# Patient Record
Sex: Female | Born: 1940 | Race: White | Hispanic: No | Marital: Married | Smoking: Never smoker
Health system: Southern US, Community
[De-identification: ages and names within clinical notes are randomized; demographics above are authoritative.]

## PROBLEM LIST (undated history)

## (undated) DIAGNOSIS — I10 Essential (primary) hypertension: Secondary | ICD-10-CM

## (undated) DIAGNOSIS — M069 Rheumatoid arthritis, unspecified: Secondary | ICD-10-CM

## (undated) DIAGNOSIS — E079 Disorder of thyroid, unspecified: Secondary | ICD-10-CM

## (undated) DIAGNOSIS — I350 Nonrheumatic aortic (valve) stenosis: Secondary | ICD-10-CM

## (undated) HISTORY — PX: CARDIAC VALVE REPLACEMENT: SHX585

---

## 2013-12-23 ENCOUNTER — Inpatient Hospital Stay (HOSPITAL_COMMUNITY): Payer: Medicare Other

## 2013-12-23 ENCOUNTER — Inpatient Hospital Stay (HOSPITAL_COMMUNITY)
Admission: EM | Admit: 2013-12-23 | Discharge: 2013-12-27 | DRG: 392 | Disposition: A | Discharge status: Home / Self Care | Payer: Medicare Other | Attending: Internal Medicine | Admitting: Internal Medicine

## 2013-12-23 ENCOUNTER — Encounter (HOSPITAL_COMMUNITY): Payer: Self-pay | Admitting: Emergency Medicine

## 2013-12-23 DIAGNOSIS — R7881 Bacteremia: Secondary | ICD-10-CM

## 2013-12-23 DIAGNOSIS — K5732 Diverticulitis of large intestine without perforation or abscess without bleeding: Secondary | ICD-10-CM

## 2013-12-23 DIAGNOSIS — Z7982 Long term (current) use of aspirin: Secondary | ICD-10-CM | POA: Diagnosis not present

## 2013-12-23 DIAGNOSIS — L02419 Cutaneous abscess of limb, unspecified: Secondary | ICD-10-CM | POA: Diagnosis present

## 2013-12-23 DIAGNOSIS — Z79899 Other long term (current) drug therapy: Secondary | ICD-10-CM | POA: Diagnosis not present

## 2013-12-23 DIAGNOSIS — I10 Essential (primary) hypertension: Secondary | ICD-10-CM

## 2013-12-23 DIAGNOSIS — E039 Hypothyroidism, unspecified: Secondary | ICD-10-CM | POA: Diagnosis present

## 2013-12-23 DIAGNOSIS — S90569A Insect bite (nonvenomous), unspecified ankle, initial encounter: Secondary | ICD-10-CM | POA: Diagnosis present

## 2013-12-23 DIAGNOSIS — E876 Hypokalemia: Secondary | ICD-10-CM | POA: Diagnosis present

## 2013-12-23 DIAGNOSIS — R6510 Systemic inflammatory response syndrome (SIRS) of non-infectious origin without acute organ dysfunction: Secondary | ICD-10-CM | POA: Diagnosis present

## 2013-12-23 DIAGNOSIS — L03116 Cellulitis of left lower limb: Secondary | ICD-10-CM

## 2013-12-23 DIAGNOSIS — M069 Rheumatoid arthritis, unspecified: Secondary | ICD-10-CM | POA: Diagnosis present

## 2013-12-23 DIAGNOSIS — L03119 Cellulitis of unspecified part of limb: Secondary | ICD-10-CM | POA: Diagnosis present

## 2013-12-23 DIAGNOSIS — A498 Other bacterial infections of unspecified site: Secondary | ICD-10-CM | POA: Diagnosis present

## 2013-12-23 DIAGNOSIS — D696 Thrombocytopenia, unspecified: Secondary | ICD-10-CM

## 2013-12-23 DIAGNOSIS — Z952 Presence of prosthetic heart valve: Secondary | ICD-10-CM | POA: Diagnosis not present

## 2013-12-23 DIAGNOSIS — D6959 Other secondary thrombocytopenia: Secondary | ICD-10-CM | POA: Diagnosis present

## 2013-12-23 DIAGNOSIS — I359 Nonrheumatic aortic valve disorder, unspecified: Secondary | ICD-10-CM | POA: Diagnosis present

## 2013-12-23 DIAGNOSIS — D638 Anemia in other chronic diseases classified elsewhere: Secondary | ICD-10-CM | POA: Diagnosis present

## 2013-12-23 DIAGNOSIS — W57XXXA Bitten or stung by nonvenomous insect and other nonvenomous arthropods, initial encounter: Secondary | ICD-10-CM

## 2013-12-23 DIAGNOSIS — I350 Nonrheumatic aortic (valve) stenosis: Secondary | ICD-10-CM

## 2013-12-23 DIAGNOSIS — K5712 Diverticulitis of small intestine without perforation or abscess without bleeding: Secondary | ICD-10-CM

## 2013-12-23 DIAGNOSIS — L03115 Cellulitis of right lower limb: Secondary | ICD-10-CM

## 2013-12-23 DIAGNOSIS — R509 Fever, unspecified: Secondary | ICD-10-CM

## 2013-12-23 DIAGNOSIS — Z953 Presence of xenogenic heart valve: Secondary | ICD-10-CM

## 2013-12-23 DIAGNOSIS — B9620 Unspecified Escherichia coli [E. coli] as the cause of diseases classified elsewhere: Secondary | ICD-10-CM

## 2013-12-23 DIAGNOSIS — Z6841 Body Mass Index (BMI) 40.0 and over, adult: Secondary | ICD-10-CM | POA: Diagnosis not present

## 2013-12-23 HISTORY — DX: Nonrheumatic aortic (valve) stenosis: I35.0

## 2013-12-23 HISTORY — DX: Disorder of thyroid, unspecified: E07.9

## 2013-12-23 HISTORY — DX: Essential (primary) hypertension: I10

## 2013-12-23 HISTORY — DX: Rheumatoid arthritis, unspecified: M06.9

## 2013-12-23 LAB — CBC
HEMATOCRIT: 33.6 % — AB (ref 36.0–46.0)
HEMOGLOBIN: 11 g/dL — AB (ref 12.0–15.0)
MCH: 30.6 pg (ref 26.0–34.0)
MCHC: 32.7 g/dL (ref 30.0–36.0)
MCV: 93.3 fL (ref 78.0–100.0)
Platelets: 120 10*3/uL — ABNORMAL LOW (ref 150–400)
RBC: 3.6 MIL/uL — AB (ref 3.87–5.11)
RDW: 13.5 % (ref 11.5–15.5)
WBC: 5.4 10*3/uL (ref 4.0–10.5)

## 2013-12-23 LAB — URINALYSIS, ROUTINE W REFLEX MICROSCOPIC
BILIRUBIN URINE: NEGATIVE
Glucose, UA: NEGATIVE mg/dL
Hgb urine dipstick: NEGATIVE
KETONES UR: NEGATIVE mg/dL
Nitrite: NEGATIVE
Protein, ur: 30 mg/dL — AB
Specific Gravity, Urine: 1.02 (ref 1.005–1.030)
UROBILINOGEN UA: 4 mg/dL — AB (ref 0.0–1.0)
pH: 6 (ref 5.0–8.0)

## 2013-12-23 LAB — COMPREHENSIVE METABOLIC PANEL
ALK PHOS: 66 U/L (ref 39–117)
ALT: 18 U/L (ref 0–35)
AST: 31 U/L (ref 0–37)
Albumin: 3.5 g/dL (ref 3.5–5.2)
Anion gap: 15 (ref 5–15)
BILIRUBIN TOTAL: 1.1 mg/dL (ref 0.3–1.2)
BUN: 16 mg/dL (ref 6–23)
CHLORIDE: 101 meq/L (ref 96–112)
CO2: 22 meq/L (ref 19–32)
Calcium: 9.1 mg/dL (ref 8.4–10.5)
Creatinine, Ser: 0.76 mg/dL (ref 0.50–1.10)
GFR calc non Af Amer: 83 mL/min — ABNORMAL LOW (ref >90)
GLUCOSE: 131 mg/dL — AB (ref 70–99)
POTASSIUM: 3.7 meq/L (ref 3.7–5.3)
SODIUM: 138 meq/L (ref 137–147)
Total Protein: 7.4 g/dL (ref 6.0–8.3)

## 2013-12-23 LAB — URINE MICROSCOPIC-ADD ON

## 2013-12-23 MED ORDER — IBUPROFEN 800 MG PO TABS
800.0000 mg | ORAL_TABLET | Freq: Once | ORAL | Status: DC
  Filled 2013-12-23: qty 1

## 2013-12-23 MED ORDER — OXYCODONE HCL 5 MG PO TABS: 5.0000 mg | ORAL_TABLET | ORAL | Status: DC | PRN

## 2013-12-23 MED ORDER — ENOXAPARIN SODIUM 40 MG/0.4ML ~~LOC~~ SOLN
40.0000 mg | SUBCUTANEOUS | Status: DC
  Administered 2013-12-23 – 2013-12-26 (×4): 40 mg via SUBCUTANEOUS
  Filled 2013-12-23 (×5): qty 0.4

## 2013-12-23 MED ORDER — SERTRALINE HCL 50 MG PO TABS
50.0000 mg | ORAL_TABLET | Freq: Every day | ORAL | Status: DC
  Administered 2013-12-24 – 2013-12-27 (×4): 50 mg via ORAL
  Filled 2013-12-23 (×4): qty 1

## 2013-12-23 MED ORDER — ONDANSETRON HCL 4 MG/2ML IJ SOLN: 4.0000 mg | Freq: Four times a day (QID) | INTRAMUSCULAR | Status: DC | PRN

## 2013-12-23 MED ORDER — ATENOLOL 50 MG PO TABS
50.0000 mg | ORAL_TABLET | Freq: Every day | ORAL | Status: DC
  Administered 2013-12-24 – 2013-12-27 (×4): 50 mg via ORAL
  Filled 2013-12-23 (×4): qty 1

## 2013-12-23 MED ORDER — LEVOTHYROXINE SODIUM 100 MCG PO TABS
100.0000 ug | ORAL_TABLET | Freq: Every day | ORAL | Status: DC
  Administered 2013-12-24 – 2013-12-27 (×5): 100 ug via ORAL
  Filled 2013-12-23 (×5): qty 1

## 2013-12-23 MED ORDER — ATORVASTATIN CALCIUM 20 MG PO TABS
20.0000 mg | ORAL_TABLET | Freq: Every day | ORAL | Status: DC
  Administered 2013-12-24 – 2013-12-26 (×3): 20 mg via ORAL
  Filled 2013-12-23 (×4): qty 1

## 2013-12-23 MED ORDER — PANTOPRAZOLE SODIUM 40 MG PO TBEC
40.0000 mg | DELAYED_RELEASE_TABLET | Freq: Every day | ORAL | Status: DC
Start: 2013-12-23 — End: 2013-12-26
  Administered 2013-12-23 – 2013-12-26 (×4): 40 mg via ORAL
  Filled 2013-12-23 (×4): qty 1

## 2013-12-23 MED ORDER — ONDANSETRON HCL 4 MG PO TABS: 4.0000 mg | ORAL_TABLET | Freq: Four times a day (QID) | ORAL | Status: DC | PRN

## 2013-12-23 MED ORDER — ACETAMINOPHEN 325 MG PO TABS
650.0000 mg | ORAL_TABLET | Freq: Four times a day (QID) | ORAL | Status: DC | PRN
  Administered 2013-12-23: 650 mg via ORAL
  Filled 2013-12-23: qty 2

## 2013-12-23 MED ORDER — ACETAMINOPHEN 650 MG RE SUPP: 650.0000 mg | Freq: Four times a day (QID) | RECTAL | Status: DC | PRN

## 2013-12-23 MED ORDER — ASPIRIN EC 81 MG PO TBEC
81.0000 mg | DELAYED_RELEASE_TABLET | Freq: Every day | ORAL | Status: DC
  Administered 2013-12-24 – 2013-12-27 (×4): 81 mg via ORAL
  Filled 2013-12-23 (×4): qty 1

## 2013-12-23 MED ORDER — CLINDAMYCIN PHOSPHATE 600 MG/50ML IV SOLN
600.0000 mg | Freq: Once | INTRAVENOUS | Status: AC
  Administered 2013-12-23: 600 mg via INTRAVENOUS
  Filled 2013-12-23: qty 50

## 2013-12-23 MED ORDER — METHOTREXATE 2.5 MG PO TABS: 15.0000 mg | ORAL_TABLET | ORAL | Status: DC

## 2013-12-23 MED ORDER — MORPHINE SULFATE 2 MG/ML IJ SOLN: 1.0000 mg | INTRAMUSCULAR | Status: DC | PRN

## 2013-12-23 MED ORDER — LOSARTAN POTASSIUM 50 MG PO TABS
50.0000 mg | ORAL_TABLET | Freq: Every day | ORAL | Status: DC
  Administered 2013-12-24: 50 mg via ORAL
  Filled 2013-12-23: qty 1

## 2013-12-23 MED ORDER — VANCOMYCIN HCL IN DEXTROSE 1-5 GM/200ML-% IV SOLN
1000.0000 mg | Freq: Once | INTRAVENOUS | Status: AC
  Administered 2013-12-23: 1000 mg via INTRAVENOUS
  Filled 2013-12-23: qty 200

## 2013-12-23 MED ORDER — ACETAMINOPHEN 325 MG PO TABS
650.0000 mg | ORAL_TABLET | Freq: Once | ORAL | Status: AC
  Administered 2013-12-23: 650 mg via ORAL
  Filled 2013-12-23: qty 2

## 2013-12-23 MED ORDER — PIPERACILLIN-TAZOBACTAM 3.375 G IVPB 30 MIN
3.3750 g | Freq: Once | INTRAVENOUS | Status: AC
  Administered 2013-12-23: 3.375 g via INTRAVENOUS
  Filled 2013-12-23: qty 50

## 2013-12-23 MED ORDER — SODIUM CHLORIDE 0.9 % IV BOLUS (SEPSIS)
1000.0000 mL | Freq: Once | INTRAVENOUS | Status: AC
  Administered 2013-12-23: 1000 mL via INTRAVENOUS

## 2013-12-23 MED ORDER — HYDROXYCHLOROQUINE SULFATE 200 MG PO TABS
200.0000 mg | ORAL_TABLET | Freq: Two times a day (BID) | ORAL | Status: DC
  Administered 2013-12-23: 200 mg via ORAL
  Filled 2013-12-23 (×3): qty 1

## 2013-12-23 NOTE — ED Notes (Signed)
Pt has episode of chills and is shaking. Family request to speak to EDP. Dr Karma Ganja at bedside.

## 2013-12-23 NOTE — ED Notes (Signed)
She c/o "bad bug bite on my leg".  C/o chills and feeling "bad" today.  Se[facility] a [facility] yesterday and they prescribed Amoxil. For prophylaxis d/t mitral valve issues.

## 2013-12-23 NOTE — ED Notes (Signed)
Pt states that she noticed bug bite to back of L calf two days ago. Pt then started to run fever yesterday and today. Around what appears to be a bite, pt has scratched the skin off to a dry scab and has reddened area that is hot to touch. Pt is A&O and in NAD. Pt lung sounds clear bilaterally.

## 2013-12-23 NOTE — ED Provider Notes (Signed)
CSN: [id number]     Arrival date & time 12/23/13  1422 History   First MD Initiated Contact with Patient 12/23/13 1504     Chief Complaint  Patient presents with  . Chills     (Consider location/radiation/quality/duration/timing/severity/associated sxs/prior Treatment) HPI Pt presents with c/o fever and chills with generalized malaise.  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  Symptoms began yesterday, this morning she felt improved until she began to have hard shaking chills.  She notes an insect bite to the back of her left leg 2days ago which has become more red since last night.  No pain in leg.  Denies cough or difficulty breathing.  There are no other associated systemic symptoms, there are no other alleviating or modifying factors.   Past Medical History  Diagnosis Date  . Hypertension   . Rheumatoid arthritis(714.0)   . Aortic stenosis   . Thyroid disease    Past Surgical History  Procedure Laterality Date  . Cardiac valve replacement      Bovine valve   History reviewed. No pertinent family history. History  Substance Use Topics  . Smoking status: Never Smoker   . Smokeless tobacco: Not on file  . Alcohol Use: Yes     Comment: Social   OB History   Grav Para Term Preterm Abortions TAB SAB Ect Mult Living                 Review of Systems ROS reviewed and all otherwise negative except for mentioned in HPI    Allergies  Review of patient's allergies indicates no known allergies.  Home Medications   Prior to Admission medications   Medication Sig Start Date End Date Taking? Authorizing Provider  alendronate (FOSAMAX) 70 MG tablet Take 70 mg by mouth every Sunday. Take with a full glass of water on an empty stomach.   Yes Historical Provider, MD  aspirin EC 81 MG tablet Take 81 mg by mouth daily.   Yes Historical Provider, MD  atenolol  (TENORMIN) 50 MG tablet Take 50 mg by mouth daily.   Yes Historical Provider, MD  calcium carbonate (OS-CAL) 600 MG TABS tablet Take 600 mg by mouth 2 (two) times daily with a meal.   Yes Historical Provider, MD  celecoxib (CELEBREX) 200 MG capsule Take 200 mg by mouth daily.   Yes Historical Provider, MD  cholecalciferol (VITAMIN D) 1000 UNITS tablet Take 1,000 Units by mouth daily.   Yes Historical Provider, MD  ferrous sulfate 325 (65 FE) MG tablet Take 325 mg by mouth daily with breakfast.   Yes Historical Provider, MD  folic acid (FOLVITE) 1 MG tablet Take 1 mg by mouth daily.   Yes Historical Provider, MD  hydroxychloroquine (PLAQUENIL) 200 MG tablet Take 200 mg by mouth 2 (two) times daily.   Yes Historical Provider, MD  levothyroxine (SYNTHROID, LEVOTHROID) 100 MCG tablet Take 100 mcg by mouth daily before breakfast.   Yes Historical Provider, MD  losartan (COZAAR) 50 MG tablet Take 50 mg by mouth daily.   Yes Historical Provider, MD  methotrexate (RHEUMATREX) 2.5 MG tablet Take 15 mg by mouth every Thursday. Caution:Chemotherapy. Protect from light.   Yes Historical Provider, MD  Omega-3 Fatty Acids (FISH OIL) 500 MG CAPS Take 500 mg by mouth 2 (two) times daily.   Yes Historical Provider, MD  omeprazole (PRILOSEC) 20 MG  capsule Take 20 mg by mouth daily.   Yes Historical Provider, MD  rosuvastatin (CRESTOR) 10 MG tablet Take 10 mg by mouth daily.   Yes Historical Provider, MD  sertraline (ZOLOFT) 50 MG tablet Take 50 mg by mouth daily.   Yes Historical Provider, MD   BP 142/80  Pulse 100  Temp(Src) 100.7 F (38.2 C) (Oral)  Resp 18  Ht 5\' 3"  (1.6 m)  Wt 229 lb 11.5 oz (104.2 kg)  BMI 40.70 kg/m2  SpO2 97% Vitals reviewed Physical Exam Physical Examination: General appearance - alert, well appearing, and in no distress Mental status - alert, oriented to person, place, and time Eyes - pupils equal and reactive, extraocular eye movements intact Mouth - mucous membranes moist,  pharynx normal without lesions Chest - clear to auscultation, no wheezes, rales or rhonchi, symmetric air entry Heart - normal rate, regular rhythm, normal S1, S2, no murmurs, rubs, clicks or gallops Abdomen - soft, nontender, nondistended, no masses or organomegaly Extremities - peripheral pulses normal, no pedal edema, no clubbing or cyanosis Skin - on posterior left calf there is abraded area approx 1cm, surrounding erythema approx 8cm, no induration or fluctuance  ED Course  Procedures (including critical care time)  5:43 PM on recheck pt is feeling much improved, she is talkative and smiling, feels much better.  Pt started on IV clindamycin- appears the cellulitis on the back of her leg is the source of her fever.    Labs Review Labs Reviewed  CBC - Abnormal; Notable for the following:    RBC 3.60 (*)    Hemoglobin 11.0 (*)    HCT 33.6 (*)    Platelets 120 (*)    All other components within normal limits  COMPREHENSIVE METABOLIC PANEL - Abnormal; Notable for the following:    Glucose, Bld 131 (*)    GFR calc non Af Amer 83 (*)    All other components within normal limits  URINALYSIS, ROUTINE W REFLEX MICROSCOPIC - Abnormal; Notable for the following:    Color, Urine AMBER (*)    Protein, ur 30 (*)    Urobilinogen, UA 4.0 (*)    Leukocytes, UA SMALL (*)    All other components within normal limits  CULTURE, BLOOD (ROUTINE X 2)  CULTURE, BLOOD (ROUTINE X 2)  URINE MICROSCOPIC-ADD ON    Imaging Review Dg Chest 2 View  12/23/2013   CLINICAL DATA:  Fever  EXAM: CHEST  2 VIEW  COMPARISON:  None.  FINDINGS: Aortic valve replacement. Heart size and vascularity are normal. Negative for heart failure. Negative for pneumonia or effusion. Lungs are clear.  IMPRESSION: No active cardiopulmonary disease.   Electronically Signed   By: 02/22/2014 M.D.   On: 12/23/2013 20:30     EKG Interpretation None      MDM   Final diagnoses:  Rheumatoid arthritis(714.0)  Cellulitis of  left leg  Fever chills    Pt presenting with c/o fever/chills, malaise, has cellulitis on her lower posterior leg after insect bite 2 days ago.  Of note, pt has no leukocytosis, UA is reassuring, no signs or symptoms of pneumonia.  Was started on clindamycin for cellultiis, then began to have recurrence of rigors, on recheck area of cellulitis was not present as it had been approx 30 minutes prior ? Vasoconstriction with fever/chills.  Blood cultures obtained and will admit to [facility].  Bovine aortic valve- endocarditis is on the differential.  D/w [facility] and they are agreeable with plan for  admission.      Ethelda Chick, MD 12/23/13 2134

## 2013-12-23 NOTE — H&P (Signed)
[facility] History and Physical  Levy Wellman FOY:[id number] DOB: 08-Jan-1941 DOA: 12/23/2013  Referring physician: EDP PCP: No primary provider on file.   Chief Complaint: FEVER YESTERDAY   HPI: Holly Hooper is a 73 y.o. female with prior h/o hypertension, RA, anemia, h/o aortic valve replacement two years ago, comes in for a bug bite two days ago , followed by fever and chills yesterday. She presents to ED for the above complaints. Also reports occasional diarrhea. On arrival to ED, she was found to be febrile , no leukocytosis. She is referred to [facility] for admission .    Review of Systems:  Constitutional:  No weight loss, night sweats, fatigue. Positive for fever and chills. HEENT:  No headaches, Difficulty swallowing,Tooth/dental problems,Sore throat,  No sneezing, itching, ear ache, nasal congestion, post nasal drip,  Cardio-vascular:  No chest pain, Orthopnea, PND, swelling in lower extremities, anasarca, dizziness, palpitations  GI:  No heartburn, indigestion, abdominal pain,vomiting, change in bowel habits, loss of appetite . Positive for nausea and diarrhea Resp:  Sob with chills.  No excess mucus, no productive cough, No non-productive cough, No coughing up of blood.No change in color of mucus.No wheezing.No chest wall deformity  Skin:  Left leg redness.  GU:  no dysuria, change in color of urine, no urgency or frequency. No flank pain.  Musculoskeletal:  No joint pain or swelling. No decreased range of motion. No back pain.    Past Medical History  Diagnosis Date  . Hypertension   . Rheumatoid arthritis(714.0)   . Aortic stenosis   . Thyroid disease    Past Surgical History  Procedure Laterality Date  . Cardiac valve replacement      Bovine valve   Social History:  reports that she has never smoked. She does not have any smokeless tobacco history on file. She reports that she drinks alcohol. Her drug history is not on file.  No Known Allergies  No family  history on file.   Prior to Admission medications   Medication Sig Start Date End Date Taking? Authorizing Provider  alendronate (FOSAMAX) 70 MG tablet Take 70 mg by mouth every Sunday. Take with a full glass of water on an empty stomach.   Yes Historical Provider, MD  aspirin EC 81 MG tablet Take 81 mg by mouth daily.   Yes Historical Provider, MD  atenolol (TENORMIN) 50 MG tablet Take 50 mg by mouth daily.   Yes Historical Provider, MD  calcium carbonate (OS-CAL) 600 MG TABS tablet Take 600 mg by mouth 2 (two) times daily with a meal.   Yes Historical Provider, MD  celecoxib (CELEBREX) 200 MG capsule Take 200 mg by mouth daily.   Yes Historical Provider, MD  cholecalciferol (VITAMIN D) 1000 UNITS tablet Take 1,000 Units by mouth daily.   Yes Historical Provider, MD  ferrous sulfate 325 (65 FE) MG tablet Take 325 mg by mouth daily with breakfast.   Yes Historical Provider, MD  folic acid (FOLVITE) 1 MG tablet Take 1 mg by mouth daily.   Yes Historical Provider, MD  hydroxychloroquine (PLAQUENIL) 200 MG tablet Take 200 mg by mouth 2 (two) times daily.   Yes Historical Provider, MD  levothyroxine (SYNTHROID, LEVOTHROID) 100 MCG tablet Take 100 mcg by mouth daily before breakfast.   Yes Historical Provider, MD  losartan (COZAAR) 50 MG tablet Take 50 mg by mouth daily.   Yes Historical Provider, MD  methotrexate (RHEUMATREX) 2.5 MG tablet Take 15 mg by mouth every Thursday. Caution:Chemotherapy. Protect  from light.   Yes Historical Provider, MD  Omega-3 Fatty Acids (FISH OIL) 500 MG CAPS Take 500 mg by mouth 2 (two) times daily.   Yes Historical Provider, MD  omeprazole (PRILOSEC) 20 MG capsule Take 20 mg by mouth daily.   Yes Historical Provider, MD  rosuvastatin (CRESTOR) 10 MG tablet Take 10 mg by mouth daily.   Yes Historical Provider, MD  sertraline (ZOLOFT) 50 MG tablet Take 50 mg by mouth daily.   Yes Historical Provider, MD   Physical Exam: Filed Vitals:   12/23/13 1730 12/23/13 1740  12/23/13 1750 12/23/13 1822  BP:  132/59 132/59 144/67  Pulse: 80 79 80   Temp:   99.5 F (37.5 C)   TempSrc:   Oral   Resp:      SpO2: 94% 96% 98%     Wt Readings from Last 3 Encounters:  No data found for Wt    General:  Appears calm and comfortable Eyes: PERRL, normal lids, irises & conjunctiva Neck: no LAD, masses or thyromegaly Cardiovascular: tachycardic, ES murmer, trace pedal edema.  Respiratory: CTA bilaterally, no w/r/r. Normal respiratory effort. Abdomen: soft, ntnd Skin: no rash or induration seen on limited exam Musculoskeletal: RA changes in the hands bilateral. Left calf redness seen.  Neurologic: alert and oriented to place, person and time. No focal deficits. Speech normal.           Labs on Admission:  Basic Metabolic Panel:  Recent Labs Lab 12/23/13 1540  NA 138  K 3.7  CL 101  CO2 22  GLUCOSE 131*  BUN 16  CREATININE 0.76  CALCIUM 9.1   Liver Function Tests:  Recent Labs Lab 12/23/13 1540  AST 31  ALT 18  ALKPHOS 66  BILITOT 1.1  PROT 7.4  ALBUMIN 3.5   No results found for this basename: LIPASE, AMYLASE,  in the last 168 hours No results found for this basename: AMMONIA,  in the last 168 hours CBC:  Recent Labs Lab 12/23/13 1540  WBC 5.4  HGB 11.0*  HCT 33.6*  MCV 93.3  PLT 120*   Cardiac Enzymes: No results found for this basename: CKTOTAL, CKMB, CKMBINDEX, TROPONINI,  in the last 168 hours  BNP (last 3 results) No results found for this basename: PROBNP,  in the last 8760 hours CBG: No results found for this basename: GLUCAP,  in the last 168 hours  Radiological Exams on Admission: No results found.  EKG: Assessment/Plan Active Problems:   Cellulitis of lower extremity   Cellulitis of left leg  1. Cellulitis of the left leg; - admit to med surg. Start patient on vancomycin and zosyn because of her ongoing chills and fever.  - blood cultures drawn.   2. Fever and chills; - blood cultures drawn.  - on IV  antibiotics.  - get CXR.    3. Bovine AV replacement.: - stable.   4. Hypertension; Controlled.   5. RA: - resume home medications.    Code Status: full code presumed DVT Prophylaxis: lovenox Family Communication: none at bedside Disposition Plan: admit to med surg  Time spent: 55 min  [facility] Pager [phone]  **Disclaimer: This note may have been dictated with voice recognition software. Similar sounding words can inadvertently be transcribed and this note may contain transcription errors which may not have been corrected upon publication of note.**

## 2013-12-24 ENCOUNTER — Inpatient Hospital Stay (HOSPITAL_COMMUNITY): Payer: Medicare Other

## 2013-12-24 DIAGNOSIS — R7881 Bacteremia: Secondary | ICD-10-CM

## 2013-12-24 DIAGNOSIS — Z954 Presence of other heart-valve replacement: Secondary | ICD-10-CM

## 2013-12-24 DIAGNOSIS — Z952 Presence of prosthetic heart valve: Secondary | ICD-10-CM

## 2013-12-24 DIAGNOSIS — M129 Arthropathy, unspecified: Secondary | ICD-10-CM

## 2013-12-24 DIAGNOSIS — R509 Fever, unspecified: Secondary | ICD-10-CM

## 2013-12-24 DIAGNOSIS — I10 Essential (primary) hypertension: Secondary | ICD-10-CM

## 2013-12-24 DIAGNOSIS — B9689 Other specified bacterial agents as the cause of diseases classified elsewhere: Secondary | ICD-10-CM

## 2013-12-24 DIAGNOSIS — D696 Thrombocytopenia, unspecified: Secondary | ICD-10-CM

## 2013-12-24 LAB — CBC WITH DIFFERENTIAL/PLATELET
Basophils Absolute: 0 10*3/uL (ref 0.0–0.1)
Basophils Relative: 0 % (ref 0–1)
EOS ABS: 0.1 10*3/uL (ref 0.0–0.7)
EOS PCT: 2 % (ref 0–5)
HCT: 29.7 % — ABNORMAL LOW (ref 36.0–46.0)
HEMOGLOBIN: 9.9 g/dL — AB (ref 12.0–15.0)
LYMPHS ABS: 0.4 10*3/uL — AB (ref 0.7–4.0)
Lymphocytes Relative: 8 % — ABNORMAL LOW (ref 12–46)
MCH: 30.7 pg (ref 26.0–34.0)
MCHC: 33.3 g/dL (ref 30.0–36.0)
MCV: 92 fL (ref 78.0–100.0)
MONOS PCT: 5 % (ref 3–12)
Monocytes Absolute: 0.2 10*3/uL (ref 0.1–1.0)
Neutro Abs: 4.4 10*3/uL (ref 1.7–7.7)
Neutrophils Relative %: 85 % — ABNORMAL HIGH (ref 43–77)
Platelets: 109 10*3/uL — ABNORMAL LOW (ref 150–400)
RBC: 3.23 MIL/uL — ABNORMAL LOW (ref 3.87–5.11)
RDW: 13.5 % (ref 11.5–15.5)
WBC: 5.2 10*3/uL (ref 4.0–10.5)

## 2013-12-24 LAB — BASIC METABOLIC PANEL
Anion gap: 13 (ref 5–15)
BUN: 12 mg/dL (ref 6–23)
CALCIUM: 8.7 mg/dL (ref 8.4–10.5)
CO2: 24 mEq/L (ref 19–32)
CREATININE: 0.76 mg/dL (ref 0.50–1.10)
Chloride: 103 mEq/L (ref 96–112)
GFR calc Af Amer: >90 mL/min (ref >90)
GFR calc non Af Amer: 83 mL/min — ABNORMAL LOW (ref >90)
GLUCOSE: 134 mg/dL — AB (ref 70–99)
Potassium: 3.3 mEq/L — ABNORMAL LOW (ref 3.7–5.3)
Sodium: 140 mEq/L (ref 137–147)

## 2013-12-24 MED ORDER — IOHEXOL 300 MG/ML  SOLN
100.0000 mL | Freq: Once | INTRAMUSCULAR | Status: AC | PRN
  Administered 2013-12-24: 100 mL via INTRAVENOUS

## 2013-12-24 MED ORDER — HYDROXYZINE HCL 25 MG PO TABS
25.0000 mg | ORAL_TABLET | Freq: Four times a day (QID) | ORAL | Status: DC | PRN
  Filled 2013-12-24: qty 1

## 2013-12-24 MED ORDER — VANCOMYCIN HCL 10 G IV SOLR
1250.0000 mg | Freq: Two times a day (BID) | INTRAVENOUS | Status: DC
  Administered 2013-12-24: 1250 mg via INTRAVENOUS
  Filled 2013-12-24 (×2): qty 1250

## 2013-12-24 MED ORDER — PIPERACILLIN-TAZOBACTAM 3.375 G IVPB
3.3750 g | Freq: Three times a day (TID) | INTRAVENOUS | Status: DC
  Administered 2013-12-24 – 2013-12-25 (×4): 3.375 g via INTRAVENOUS
  Filled 2013-12-24 (×5): qty 50

## 2013-12-24 MED ORDER — IOHEXOL 300 MG/ML  SOLN
25.0000 mL | INTRAMUSCULAR | Status: AC
  Administered 2013-12-24 (×2): 25 mL via ORAL

## 2013-12-24 NOTE — Consult Note (Signed)
[facility]    Date of Admission:  12/23/2013  Date of Consult:  12/24/2013  Reason for Consult:Gram negative bacteremia Referring Physician: Dr. Grandville Silos   HPI: Holly Hooper is an 73 y.o. female. With history of rheumatoid arthritis on methotrexate also with history of porcine aortic valve replacement who was visiting [place] from [place] when approximately 2 and half weeks ago she and her husband were plagued by "bug bites. They were doing well but then this past week she noticed what she thought was another bite on her leg which she scratched open and which bled. By Saturday she developed fevers chills and nausea with dry heaves her husband called her from [place] where he Re: [place]rice Red and was concerned of the fact she seemed incoherent the son. He flew back to [place] to see her and on $Remo'Sunday she was doing poorly and continued to be febrile. There is an area on her leg around the bite where she scratched herself is intensely erythematous. She has seen numerous partner that the hospitalist service. Blood cultures were drawn on admission chest x-ray was done which was negative for pneumonia. She was started on vancomycin and Zosyn. In the interim her admission blood cultures come back positive for gram-negative rods in 2 out of 2 blood cultures.   Past Medical History  Diagnosis Date  . Hypertension   . Rheumatoid arthritis(714.0)   . Aortic stenosis   . Thyroid disease     Past Surgical History  Procedure Laterality Date  . Cardiac valve replacement      Bovine valve  ergies:   No Known Allergies   Medications: I have reviewed patients current medications as documented in Epic Anti-infectives   Start     Dose/Rate Route Frequency Ordered Stop   12/24/13 0600  vancomycin (VANCOCIN) 1,250 mg in sodium chloride 0.9 % 250 mL IVPB  Status:  Discontinued     1,250 mg 166.7 mL/hr over 90 Minutes Intravenous Every 12 hours 12/24/13 0514 12/24/13 1101    12/24/13 0600  piperacillin-tazobactam (ZOSYN) IVPB 3.375 g     3.375 g 12.5 mL/hr over 240 Minutes Intravenous 3 times per day 12/24/13 0514     12/23/13 2200  hydroxychloroquine (PLAQUENIL) tablet 200 mg  Status:  Discontinued     200 mg Oral 2 times daily 12/23/13 1942 12/24/13 0915   12/23/13 2015  vancomycin (VANCOCIN) IVPB 1000 mg/200 mL premix     1,000 mg 200 mL/hr over 60 Minutes Intravenous  Once 12/23/13 2001 12/23/13 2145   12/23/13 2015  piperacillin-tazobactam (ZOSYN) IVPB 3.375 g     3.375 g 100 mL/hr over 30 Minutes Intravenous  Once 12/23/13 2001 12/23/13 2222   12/23/13 1730  clindamycin (CLEOCIN) IVPB 600 mg     60'iAEri$ 0 mg 100 mL/hr over 30 Minutes Intravenous  Once 12/23/13 1722 12/23/13 1810      Social History:  reports that she has never smoked. She does not have any smokeless tobacco history on file. She reports that she drinks alcohol. Her drug history is not on file.  History reviewed. No pertinent family history.  As in HPI and primary teams notes otherwise 12 point review of systems is negative  Blood pressure 138/66, pulse 72, temperature 98.2 F (36.8 C), temperature source Oral, resp. rate 20, height $RemoveBe'5\' 3"'RvqQSQaau$  (1.6 m), weight 229 lb 11.5 oz (104.2 kg), SpO2 97.00%. General: Alert and awake, oriented x3, not in any acute distress. HEENT: anicteric sclera, ,  EOMI, oropharynx clear and without exudate CVS regular rate, normal r, aortic click Chest: clear to auscultation bilaterally, no wheezing, rales or rhonchi Abdomen: soft nontender, nondistended, normal bowel sounds, Extremities: no  clubbing or edema noted bilaterally Skin: site of her "bug bite: not with much erythema      Neuro: nonfocal, strength and sensation intact   Results for orders placed during the hospital encounter of 12/23/13 (from the past 48 hour(s))  CBC     Status: Abnormal   Collection Time    12/23/13  3:40 PM      Result Value Ref Range   WBC 5.4  4.0 - 10.5 K/uL   RBC 3.60  (*) 3.87 - 5.11 MIL/uL   Hemoglobin 11.0 (*) 12.0 - 15.0 g/dL   HCT 33.6 (*) 36.0 - 46.0 %   MCV 93.3  78.0 - 100.0 fL   MCH 30.6  26.0 - 34.0 pg   MCHC 32.7  30.0 - 36.0 g/dL   RDW 13.5  11.5 - 15.5 %   Platelets 120 (*) 150 - 400 K/uL  COMPREHENSIVE METABOLIC PANEL     Status: Abnormal   Collection Time    12/23/13  3:40 PM      Result Value Ref Range   Sodium 138  137 - 147 mEq/L   Potassium 3.7  3.7 - 5.3 mEq/L   Chloride 101  96 - 112 mEq/L   CO2 22  19 - 32 mEq/L   Glucose, Bld 131 (*) 70 - 99 mg/dL   BUN 16  6 - 23 mg/dL   Creatinine, Ser 0.76  0.50 - 1.10 mg/dL   Calcium 9.1  8.4 - 10.5 mg/dL   Total Protein 7.4  6.0 - 8.3 g/dL   Albumin 3.5  3.5 - 5.2 g/dL   AST 31  0 - 37 U/L   ALT 18  0 - 35 U/L   Alkaline Phosphatase 66  39 - 117 U/L   Total Bilirubin 1.1  0.3 - 1.2 mg/dL   GFR calc non Af Amer 83 (*) >90 mL/min   GFR calc Af Amer >90  >90 mL/min   Comment: (NOTE)     The eGFR has been calculated using the CKD EPI equation.     This calculation has not been validated in all clinical situations.     eGFR's persistently <90 mL/min signify possible Chronic Kidney     Disease.   Anion gap 15  5 - 15  URINALYSIS, ROUTINE W REFLEX MICROSCOPIC     Status: Abnormal   Collection Time    12/23/13  4:02 PM      Result Value Ref Range   Color, Urine AMBER (*) YELLOW   Comment: BIOCHEMICALS MAY BE AFFECTED BY COLOR   APPearance CLEAR  CLEAR   Specific Gravity, Urine 1.020  1.005 - 1.030   pH 6.0  5.0 - 8.0   Glucose, UA NEGATIVE  NEGATIVE mg/dL   Hgb urine dipstick NEGATIVE  NEGATIVE   Bilirubin Urine NEGATIVE  NEGATIVE   Ketones, ur NEGATIVE  NEGATIVE mg/dL   Protein, ur 30 (*) NEGATIVE mg/dL   Urobilinogen, UA 4.0 (*) 0.0 - 1.0 mg/dL   Nitrite NEGATIVE  NEGATIVE   Leukocytes, UA SMALL (*) NEGATIVE  URINE MICROSCOPIC-ADD ON     Status: None   Collection Time    12/23/13  4:02 PM      Result Value Ref Range   Squamous Epithelial / LPF RARE  RARE  WBC, UA 3-6   <3 WBC/hpf   Urine-Other MUCOUS PRESENT    CULTURE, BLOOD (ROUTINE X 2)     Status: None   Collection Time    12/23/13  5:08 PM      Result Value Ref Range   Specimen Description BLOOD RIGHT ANTECUBITAL     Special Requests BOTTLES DRAWN AEROBIC AND ANAEROBIC 2.5CC EACH     Culture  Setup Time       Value: 12/23/2013 20:46     Performed at [facility]   Culture       Value: [place]     Note: Gram Stain Report Called to,Read Back By and Verified With: Edwyna Ready RN on 12/24/13 at 06:35 by Rise Mu     Performed at [facility]   Report Status PENDING    CULTURE, BLOOD (ROUTINE X 2)     Status: None   Collection Time    12/23/13  5:10 PM      Result Value Ref Range   Specimen Description BLOOD LEFT ANTECUBITAL     Special Requests BOTTLES DRAWN AEROBIC AND ANAEROBIC 2.5CC EACH     Culture  Setup Time       Value: 12/23/2013 20:46     Performed at [facility]   Culture       Value: GRAM NEGATIVE RODS     Note: Gram Stain Report Called to,Read Back By and Verified With: ASHTON MIRRITTE @ 6:11AM 8.3.15 BY DESIS     Performed at [facility]   Report Status PENDING    CBC WITH DIFFERENTIAL     Status: Abnormal   Collection Time    12/24/13  9:23 AM      Result Value Ref Range   WBC 5.2  4.0 - 10.5 K/uL   RBC 3.23 (*) 3.87 - 5.11 MIL/uL   Hemoglobin 9.9 (*) 12.0 - 15.0 g/dL   HCT 29.7 (*) 36.0 - 46.0 %   MCV 92.0  78.0 - 100.0 fL   MCH 30.7  26.0 - 34.0 pg   MCHC 33.3  30.0 - 36.0 g/dL   RDW 13.5  11.5 - 15.5 %   Platelets 109 (*) 150 - 400 K/uL   Comment: SPECIMEN CHECKED FOR CLOTS     PLATELET COUNT CONFIRMED BY SMEAR   Neutrophils Relative % 85 (*) 43 - 77 %   Neutro Abs 4.4  1.7 - 7.7 K/uL   Lymphocytes Relative 8 (*) 12 - 46 %   Lymphs Abs 0.4 (*) 0.7 - 4.0 K/uL   Monocytes Relative 5  3 - 12 %   Monocytes Absolute 0.2  0.1 - 1.0 K/uL   Eosinophils Relative 2  0 - 5 %   Eosinophils Absolute 0.1  0.0 - 0.7 K/uL    Basophils Relative 0  0 - 1 %   Basophils Absolute 0.0  0.0 - 0.1 K/uL  BASIC METABOLIC PANEL     Status: Abnormal   Collection Time    12/24/13  9:23 AM      Result Value Ref Range   Sodium 140  137 - 147 mEq/L   Potassium 3.3 (*) 3.7 - 5.3 mEq/L   Chloride 103  96 - 112 mEq/L   CO2 24  19 - 32 mEq/L   Glucose, Bld 134 (*) 70 - 99 mg/dL   BUN 12  6 - 23 mg/dL   Creatinine, Ser 0.76  0.50 - 1.10 mg/dL  Calcium 8.7  8.4 - 10.5 mg/dL   GFR calc non Af Amer 83 (*) >90 mL/min   GFR calc Af Amer >90  >90 mL/min   Comment: (NOTE)     The eGFR has been calculated using the CKD EPI equation.     This calculation has not been validated in all clinical situations.     eGFR's persistently <90 mL/min signify possible Chronic Kidney     Disease.   Anion gap 13  5 - 15   '@BRIEFLABTABLE'$ (sdes,specrequest,cult,reptstatus)   ) Recent Results (from the past 720 hour(s))  CULTURE, BLOOD (ROUTINE X 2)     Status: None   Collection Time    12/23/13  5:08 PM      Result Value Ref Range Status   Specimen Description BLOOD RIGHT ANTECUBITAL   Final   Special Requests BOTTLES DRAWN AEROBIC AND ANAEROBIC 2.5CC EACH   Final   Culture  Setup Time     Final   Value: 12/23/2013 20:46     Performed at [facility]   Culture     Final   Value: GRAM NEGATIVE RODS     Note: Gram Stain Report Called to,Read Back By and Verified With: Edwyna Ready RN on 12/24/13 at 06:35 by Rise Mu     Performed at [facility]   Report Status PENDING   Incomplete  CULTURE, BLOOD (ROUTINE X 2)     Status: None   Collection Time    12/23/13  5:10 PM      Result Value Ref Range Status   Specimen Description BLOOD LEFT ANTECUBITAL   Final   Special Requests BOTTLES DRAWN AEROBIC AND ANAEROBIC 2.5CC EACH   Final   Culture  Setup Time     Final   Value: 12/23/2013 20:46     Performed at [facility]   Culture     Final   Value: GRAM NEGATIVE RODS     Note: Gram Stain Report Called to,Read Back  By and Verified With: ASHTON MIRRITTE @ 6:11AM 8.3.15 BY DESIS     Performed at [facility]   Report Status PENDING   Incomplete     Impression/Recommendation  Principal Problem:   Bacteremia due to Gram-negative bacteria Active Problems:   Rheumatoid arthritis(714.0)   Cellulitis of lower extremity   Cellulitis of left leg   Fever chills   Zamyra Allensworth is a 74 y.o. female with  arthritis, aortic valve replacement admitted with fever SIRS, thought to be some random cellulitis some slight found to have gram-negative bacteremia.  #1 gram-negative bacteremia:  It is possible that the source couldn't bend her leg where she scratched herself though gram-negative pathogens or not common skin bacteria. I worry that she could have a possible occult intra-abdominal infection. Finally just noticed that on this occasion as she is growing gram-negative rods in her urine as well.  --I ordered a CT scan of the abdomen and pelvis with contrast to rule out occult infection in the abdomen --Discontinue her angiotensin receptor blocker while she is being exposed to contrast.  --Discontinue her vancomycin --I will continue Zosyn for now, though I do not see a clear need for anaerobic coverage. --Will consider what antibiotics narrower 2 in the morning. Will weigh IV antibiotics versus orals depending on what we find as the source. Of note the patient has been exposed to 2 different friends who had had C. difficile colitis one who had a severe form of it  which might make [place] more reluctant to prescribe a fluoroquinolone.  #2 thrombocytopenia: Likely due to her severe infection need to monitor closely in case is related to her antibiotics again vancomycin is going away  #3 Screening: will check hiv and hep panel  .   12/24/2013, 3:43 PM   Thank you so much for this interesting consult  [facility] [place] [phone] (pager) [phone]  (office) 12/24/2013, 3:43 PM  Rhina [place] 12/24/2013, 3:43 PM

## 2013-12-24 NOTE — Progress Notes (Signed)
Echocardiogram 2D Echocardiogram has been performed.  Holly Hooper 12/24/2013, 1:49 PM

## 2013-12-24 NOTE — Progress Notes (Signed)
[facility] PROGRESS NOTE  Holly Hooper ZOX:[id number] DOB: 09-24-1940 DOA: 12/23/2013 PCP: No primary provider on file.  Assessment/Plan: #1 gram-negative bacteremia Patient had presented with fever and chills. Initially felt to be secondary to a cellulitis. Blood cultures preliminarily gram-negative rods. Patient has a porcine aortic valve and doubt if there is any vegetations. Will discontinue IV vancomycin. Continue IV Zosyn. We'll consult with infectious disease for further evaluation and management.  #2 left lower extremity cellulitis Area has improved significantly. Blood cultures growing gram-negative rods. Will discontinue IV vancomycin. Continue IV Zosyn.  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 Continue atenolol, Lipitor  #4 rheumatoid arthritis Will discontinue medications secondary to problem #1.  #5 hypertension Stable. Continue atenolol.  #6 hypothyroidism Continue Synthroid.  #7 prophylaxis PPI for GI prophylaxis. Lovenox for DVT prophylaxis.  Code Status:  full Family Communication: Updated patient and husband at bedside. Disposition Plan: Home when medically stable.   Consultants:  ID pending  Procedures:  Chest x-ray 12/23/2013  Antibiotics:  IV vancomycin 12/23/2013>>>> 12/24/2013  IV Zosyn 12/23/2013  HPI/Subjective: Patient with no complaints.  Objective: Filed Vitals:   12/24/13 0522  BP: 136/76  Pulse: 66  Temp: 97.5 F (36.4 C)  Resp: 18    Intake/Output Summary (Last 24 hours) at 12/24/13 1103 Last data filed at 12/24/13 0837  Gross per 24 hour  Intake      0 ml  Output      2 ml  Net     -2 ml   Filed Weights   12/23/13 1946  Weight: 104.2 kg (229 lb 11.5 oz)    Exam:   General:  NAD  Cardiovascular: RRR with 3/6 SEM  Respiratory: Clear to auscultation bilaterally no wheezing no crackles no rhonchi.  Abdomen: Obese, soft, nontender, nondistended, positive bowel sounds.  Musculoskeletal: No  clubbing cyanosis or edema. Posterior left calf area with a small area that seems like a skin tear/bug bite area. Decreased erythema, no warmth, nontender to palpation.  Data Reviewed: Basic Metabolic Panel:  Recent Labs Lab 12/23/13 1540 12/24/13 0923  NA 138 140  K 3.7 3.3*  CL 101 103  CO2 22 24  GLUCOSE 131* 134*  BUN 16 12  CREATININE 0.76 0.76  CALCIUM 9.1 8.7   Liver Function Tests:  Recent Labs Lab 12/23/13 1540  AST 31  ALT 18  ALKPHOS 66  BILITOT 1.1  PROT 7.4  ALBUMIN 3.5   No results found for this basename: LIPASE, AMYLASE,  in the last 168 hours No results found for this basename: AMMONIA,  in the last 168 hours CBC:  Recent Labs Lab 12/23/13 1540 12/24/13 0923  WBC 5.4 5.2  NEUTROABS  --  4.4  HGB 11.0* 9.9*  HCT 33.6* 29.7*  MCV 93.3 92.0  PLT 120* 109*   Cardiac Enzymes: No results found for this basename: CKTOTAL, CKMB, CKMBINDEX, TROPONINI,  in the last 168 hours BNP (last 3 results) No results found for this basename: PROBNP,  in the last 8760 hours CBG: No results found for this basename: GLUCAP,  in the last 168 hours  Recent Results (from the past 240 hour(s))  CULTURE, BLOOD (ROUTINE X 2)     Status: None   Collection Time    12/23/13  5:08 PM      Result Value Ref Range Status   Specimen Description BLOOD RIGHT ANTECUBITAL   Final   Special Requests BOTTLES DRAWN AEROBIC AND ANAEROBIC 2.5CC EACH   Final   Culture  Setup Time     Final   Value: 12/23/2013 20:46     Performed at [facility]   Culture     Final   Value: GRAM NEGATIVE RODS     Note: Gram Stain Report Called to,Read Back By and Verified With: Verner Chol RN on 12/24/13 at 06:35 by Christie Nottingham     Performed at [facility]   Report Status PENDING   Incomplete  CULTURE, BLOOD (ROUTINE X 2)     Status: None   Collection Time    12/23/13  5:10 PM      Result Value Ref Range Status   Specimen Description BLOOD LEFT ANTECUBITAL   Final   Special  Requests BOTTLES DRAWN AEROBIC AND ANAEROBIC 2.5CC EACH   Final   Culture  Setup Time     Final   Value: 12/23/2013 20:46     Performed at [facility]   Culture     Final   Value: GRAM NEGATIVE RODS     Note: Gram Stain Report Called to,Read Back By and Verified With: ASHTON MIRRITTE @ 6:11AM 8.3.15 BY DESIS     Performed at [facility]   Report Status PENDING   Incomplete     Studies: Dg Chest 2 View  12/23/2013   CLINICAL DATA:  Fever  EXAM: CHEST  2 VIEW  COMPARISON:  None.  FINDINGS: Aortic valve replacement. Heart size and vascularity are normal. Negative for heart failure. Negative for pneumonia or effusion. Lungs are clear.  IMPRESSION: No active cardiopulmonary disease.   Electronically Signed   By: Marlan Palau M.D.   On: 12/23/2013 20:30    Scheduled Meds: . aspirin EC  81 mg Oral Daily  . atenolol  50 mg Oral Daily  . atorvastatin  20 mg Oral q1800  . enoxaparin (LOVENOX) injection  40 mg Subcutaneous Q24H  . levothyroxine  100 mcg Oral QAC breakfast  . losartan  50 mg Oral Daily  . [START ON 12/27/2013] methotrexate  15 mg Oral Q Thu  . pantoprazole  40 mg Oral Daily  . piperacillin-tazobactam (ZOSYN)  IV  3.375 g Intravenous 3 times per day  . sertraline  50 mg Oral Daily   Continuous Infusions:   Principal Problem:   Bacteremia due to Gram-negative bacteria Active Problems:   Rheumatoid arthritis(714.0)   Cellulitis of lower extremity   Cellulitis of left leg   Fever chills    Time spent: 40 MINS    [facility] MD [facility] Pager [phone]. If 7PM-7AM, please contact night-coverage at [email or web address], password [facility] 12/24/2013, 11:03 AM  LOS: 1 day

## 2013-12-24 NOTE — Progress Notes (Signed)
ANTIBIOTIC CONSULT NOTE - INITIAL  Pharmacy Consult for Vancomycin and Zosyn  Indication: cellulitis with h/o aortic valve replacement.   No Known Allergies  Patient Measurements: Height: 5\' 3"  (160 cm) Weight: 229 lb 11.5 oz (104.2 kg) IBW/kg (Calculated) : 52.4 Adjusted Body Weight:   Vital Signs: Temp: 98.5 F (36.9 C) (08/02 2158) Temp src: Oral (08/02 2158) BP: 142/80 mmHg (08/02 1946) Pulse Rate: 100 (08/02 1946) Intake/Output from previous day:   Intake/Output from this shift:    Labs:  Recent Labs  12/23/13 1540  WBC 5.4  HGB 11.0*  PLT 120*  CREATININE 0.76   Estimated Creatinine Clearance: 75.5 ml/min (by C-G formula based on Cr of 0.76). No results found for this basename: VANCOTROUGH, VANCOPEAK, VANCORANDOM, GENTTROUGH, GENTPEAK, GENTRANDOM, TOBRATROUGH, TOBRAPEAK, TOBRARND, AMIKACINPEAK, AMIKACINTROU, AMIKACIN,  in the last 72 hours   Microbiology: No results found for this or any previous visit (from the past 720 hour(s)).  Medical History: Past Medical History  Diagnosis Date  . Hypertension   . Rheumatoid arthritis(714.0)   . Aortic stenosis   . Thyroid disease     Medications:  Anti-infectives   Start     Dose/Rate Route Frequency Ordered Stop   12/24/13 0600  vancomycin (VANCOCIN) 1,250 mg in sodium chloride 0.9 % 250 mL IVPB     1,250 mg 166.7 mL/hr over 90 Minutes Intravenous Every 12 hours 12/24/13 0514     12/24/13 0600  piperacillin-tazobactam (ZOSYN) IVPB 3.375 g     3.375 g 12.5 mL/hr over 240 Minutes Intravenous 3 times per day 12/24/13 0514     12/23/13 2200  hydroxychloroquine (PLAQUENIL) tablet 200 mg     200 mg Oral 2 times daily 12/23/13 1942     12/23/13 2015  vancomycin (VANCOCIN) IVPB 1000 mg/200 mL premix     1,000 mg 200 mL/hr over 60 Minutes Intravenous  Once 12/23/13 2001 12/23/13 2145   12/23/13 2015  piperacillin-tazobactam (ZOSYN) IVPB 3.375 g     3.375 g 100 mL/hr over 30 Minutes Intravenous  Once 12/23/13  2001 12/23/13 2222   12/23/13 1730  clindamycin (CLEOCIN) IVPB 600 mg     600 mg 100 mL/hr over 30 Minutes Intravenous  Once 12/23/13 1722 12/23/13 1810     Assessment: Patient with cellulitis with h/o aortic valve replacement.  Goal of Therapy:  Vancomycin trough level 15-20 mcg/ml Zosyn based on renal function   Plan:  Measure antibiotic drug levels at steady state Follow up culture results Vancomycin 1250mg  iv q12hr Zosyn 3.375g IV Q8H infused over 4hrs.   02/22/14, [place] 12/24/2013,5:15 AM

## 2013-12-25 DIAGNOSIS — D696 Thrombocytopenia, unspecified: Secondary | ICD-10-CM | POA: Diagnosis present

## 2013-12-25 DIAGNOSIS — A498 Other bacterial infections of unspecified site: Secondary | ICD-10-CM

## 2013-12-25 DIAGNOSIS — R6510 Systemic inflammatory response syndrome (SIRS) of non-infectious origin without acute organ dysfunction: Secondary | ICD-10-CM

## 2013-12-25 LAB — CBC WITH DIFFERENTIAL/PLATELET
BASOS ABS: 0 10*3/uL (ref 0.0–0.1)
Basophils Relative: 0 % (ref 0–1)
EOS ABS: 0.1 10*3/uL (ref 0.0–0.7)
EOS PCT: 2 % (ref 0–5)
HCT: 30.8 % — ABNORMAL LOW (ref 36.0–46.0)
Hemoglobin: 10 g/dL — ABNORMAL LOW (ref 12.0–15.0)
Lymphocytes Relative: 23 % (ref 12–46)
Lymphs Abs: 1 10*3/uL (ref 0.7–4.0)
MCH: 30.3 pg (ref 26.0–34.0)
MCHC: 32.5 g/dL (ref 30.0–36.0)
MCV: 93.3 fL (ref 78.0–100.0)
Monocytes Absolute: 0.4 10*3/uL (ref 0.1–1.0)
Monocytes Relative: 10 % (ref 3–12)
NEUTROS PCT: 65 % (ref 43–77)
Neutro Abs: 2.7 10*3/uL (ref 1.7–7.7)
PLATELETS: 127 10*3/uL — AB (ref 150–400)
RBC: 3.3 MIL/uL — ABNORMAL LOW (ref 3.87–5.11)
RDW: 13.7 % (ref 11.5–15.5)
WBC: 4.2 10*3/uL (ref 4.0–10.5)

## 2013-12-25 LAB — URINE CULTURE: Colony Count: 1000

## 2013-12-25 LAB — VITAMIN B12: VITAMIN B 12: 389 pg/mL (ref 211–911)

## 2013-12-25 LAB — IRON AND TIBC
IRON: 25 ug/dL — AB (ref 42–135)
SATURATION RATIOS: 9 % — AB (ref 20–55)
TIBC: 266 ug/dL (ref 250–470)
UIBC: 241 ug/dL (ref 125–400)

## 2013-12-25 LAB — FOLATE: Folate: 17.9 ng/mL

## 2013-12-25 LAB — COMPREHENSIVE METABOLIC PANEL
ALBUMIN: 3 g/dL — AB (ref 3.5–5.2)
ALK PHOS: 65 U/L (ref 39–117)
ALT: 17 U/L (ref 0–35)
AST: 24 U/L (ref 0–37)
Anion gap: 11 (ref 5–15)
BUN: 9 mg/dL (ref 6–23)
CALCIUM: 8.7 mg/dL (ref 8.4–10.5)
CO2: 25 mEq/L (ref 19–32)
Chloride: 105 mEq/L (ref 96–112)
Creatinine, Ser: 0.98 mg/dL (ref 0.50–1.10)
GFR calc Af Amer: 66 mL/min — ABNORMAL LOW (ref >90)
GFR calc non Af Amer: 57 mL/min — ABNORMAL LOW (ref >90)
Glucose, Bld: 102 mg/dL — ABNORMAL HIGH (ref 70–99)
POTASSIUM: 3.5 meq/L — AB (ref 3.7–5.3)
Sodium: 141 mEq/L (ref 137–147)
TOTAL PROTEIN: 6.4 g/dL (ref 6.0–8.3)
Total Bilirubin: 0.7 mg/dL (ref 0.3–1.2)

## 2013-12-25 LAB — CLOSTRIDIUM DIFFICILE BY PCR: Toxigenic C. Difficile by PCR: NEGATIVE

## 2013-12-25 LAB — HEPATITIS PANEL, ACUTE
HCV AB: NEGATIVE
HEP B S AG: NEGATIVE
Hep A IgM: NONREACTIVE
Hep B C IgM: NONREACTIVE

## 2013-12-25 LAB — FERRITIN: Ferritin: 209 ng/mL (ref 10–291)

## 2013-12-25 MED ORDER — LOPERAMIDE HCL 2 MG PO CAPS
4.0000 mg | ORAL_CAPSULE | Freq: Once | ORAL | Status: AC
  Administered 2013-12-25: 4 mg via ORAL
  Filled 2013-12-25: qty 2

## 2013-12-25 MED ORDER — LOPERAMIDE HCL 2 MG PO CAPS: 2.0000 mg | ORAL_CAPSULE | ORAL | Status: DC | PRN

## 2013-12-25 MED ORDER — POTASSIUM CHLORIDE CRYS ER 20 MEQ PO TBCR
40.0000 meq | EXTENDED_RELEASE_TABLET | Freq: Once | ORAL | Status: AC
  Administered 2013-12-25: 40 meq via ORAL
  Filled 2013-12-25: qty 2

## 2013-12-25 MED ORDER — SODIUM CHLORIDE 0.9 % IV BOLUS (SEPSIS)
250.0000 mL | Freq: Once | INTRAVENOUS | Status: AC
  Administered 2013-12-25: 250 mL via INTRAVENOUS

## 2013-12-25 MED ORDER — DEXTROSE 5 % IV SOLN
2.0000 g | INTRAVENOUS | Status: DC
  Administered 2013-12-25 – 2013-12-26 (×2): 2 g via INTRAVENOUS
  Filled 2013-12-25 (×2): qty 2

## 2013-12-25 NOTE — Progress Notes (Signed)
[facility] PROGRESS NOTE  Anne Nglise Kinchen ZOX:[id number] DOB: 08/25/1940 DOA: 12/23/2013 PCP: No primary provider on file.  Assessment/Plan: #1 EColi bacteremia Patient had presented with fever and chills. CT of the abdomen and pelvis showing thickening in the sigmoid colon consistent with chronic diverticulitis which is likely source of bacteremia. Blood cultures with Escherichia coli. Sensitivities pending. Repeat blood cultures with no growth to date.  Patient has a porcine aortic valve and no vegetations. 2-D echo. IV Zosyn has been changed to IV Rocephin per ID. ID following and appreciate input and recommendations.  #2 left lower extremity cellulitis Area has improved significantly. Blood cultures growing Ecoli. IV Zosyn has been changed to IV Rocephin per ID.   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 2-D echo with normal EF and normal appearing tissue aVR with no AR Wwith normal systolic gradients.. Continue atenolol, Lipitor  #4 rheumatoid arthritis Rheumatoid arthritis medications on hold secondary to gram-negative bacteremia.  #5 hypertension Stable. Continue atenolol.  #6 hypothyroidism Continue Synthroid.  #7 thrombocytopenia Likely secondary to acute infection. Improving. Follow.  #8 prophylaxis PPI for GI prophylaxis. Lovenox for DVT prophylaxis.  Code Status:  full Family Communication: Updated patient and husband at bedside. Disposition Plan: Home when medically stable, hopefully one to 2 days   Consultants:  ID Dr Daiva EvesVan Dam 12/24/13  Procedures:  Chest x-ray 12/23/2013  2-D echo 12/24/2013  CT abdomen and pelvis 12/24/2013  Antibiotics:  IV vancomycin 12/23/2013>>>> 12/24/2013  IV Zosyn 12/23/2013>>>>> 12/25/2013  IV Rocephin 12/25/2013  HPI/Subjective: Patient with no complaints.  Objective: Filed Vitals:   12/25/13 0614  BP: 138/61  Pulse: 65  Temp: 98.9 F (37.2 C)  Resp: 18    Intake/Output Summary (Last 24 hours)  at 12/25/13 1816 Last data filed at 12/25/13 1554  Gross per 24 hour  Intake      0 ml  Output      8 ml  Net     -8 ml   Filed Weights   12/23/13 1946  Weight: 104.2 kg (229 lb 11.5 oz)    Exam:   General:  NAD  Cardiovascular: RRR with 3/6 SEM  Respiratory: Clear to auscultation bilaterally no wheezing no crackles no rhonchi.  Abdomen: Obese, soft, nontender, nondistended, positive bowel sounds.  Musculoskeletal: No clubbing cyanosis or edema. Posterior left calf area with a small area that seems like a skin tear/bug bite area. Decreased erythema, no warmth, nontender to palpation.  Data Reviewed: Basic Metabolic Panel:  Recent Labs Lab 12/23/13 1540 12/24/13 0923 12/25/13 0450  NA 138 140 141  K 3.7 3.3* 3.5*  CL 101 103 105  CO2 22 24 25   GLUCOSE 131* 134* 102*  BUN 16 12 9   CREATININE 0.76 0.76 0.98  CALCIUM 9.1 8.7 8.7   Liver Function Tests:  Recent Labs Lab 12/23/13 1540 12/25/13 0450  AST 31 24  ALT 18 17  ALKPHOS 66 65  BILITOT 1.1 0.7  PROT 7.4 6.4  ALBUMIN 3.5 3.0*   No results found for this basename: LIPASE, AMYLASE,  in the last 168 hours No results found for this basename: AMMONIA,  in the last 168 hours CBC:  Recent Labs Lab 12/23/13 1540 12/24/13 0923 12/25/13 0450  WBC 5.4 5.2 4.2  NEUTROABS  --  4.4 2.7  HGB 11.0* 9.9* 10.0*  HCT 33.6* 29.7* 30.8*  MCV 93.3 92.0 93.3  PLT 120* 109* 127*   Cardiac Enzymes: No results found for this basename: CKTOTAL, CKMB, CKMBINDEX, TROPONINI,  in the last 168 hours BNP (last 3 results) No results found for this basename: PROBNP,  in the last 8760 hours CBG: No results found for this basename: GLUCAP,  in the last 168 hours  Recent Results (from the past 240 hour(s))  URINE CULTURE     Status: None   Collection Time    12/23/13  4:00 PM      Result Value Ref Range Status   Specimen Description URINE, RANDOM   Final   Special Requests NONE   Final   Culture  Setup Time     Final    Value: 12/24/2013 11:54     Performed at [facility] Count     Final   Value: 1,000 COLONIES/ML     Performed at [facility]   Culture     Final   Value: INSIGNIFICANT GROWTH     Performed at [facility]   Report Status 12/25/2013 FINAL   Final  CULTURE, BLOOD (ROUTINE X 2)     Status: None   Collection Time    12/23/13  5:08 PM      Result Value Ref Range Status   Specimen Description BLOOD RIGHT ANTECUBITAL   Final   Special Requests BOTTLES DRAWN AEROBIC AND ANAEROBIC 2.5CC EACH   Final   Culture  Setup Time     Final   Value: 12/23/2013 20:46     Performed at [facility]   Culture     Final   Value: ESCHERICHIA COLI     Note: Gram Stain Report Called to,Read Back By and Verified With: Verner Chol RN on 12/24/13 at 06:35 by Christie Nottingham     Performed at [facility]   Report Status PENDING   Incomplete  CULTURE, BLOOD (ROUTINE X 2)     Status: None   Collection Time    12/23/13  5:10 PM      Result Value Ref Range Status   Specimen Description BLOOD LEFT ANTECUBITAL   Final   Special Requests BOTTLES DRAWN AEROBIC AND ANAEROBIC 2.5CC EACH   Final   Culture  Setup Time     Final   Value: 12/23/2013 20:46     Performed at [facility]   Culture     Final   Value: ESCHERICHIA COLI     Note: Gram Stain Report Called to,Read Back By and Verified With: ASHTON MIRRITTE @ 6:11AM 8.3.15 BY DESIS     Performed at [facility]   Report Status PENDING   Incomplete  CULTURE, BLOOD (ROUTINE X 2)     Status: None   Collection Time    12/24/13  5:55 PM      Result Value Ref Range Status   Specimen Description BLOOD RIGHT ARM   Final   Special Requests BOTTLES DRAWN AEROBIC AND ANAEROBIC 10CC   Final   Culture  Setup Time     Final   Value: 12/24/2013 20:48     Performed at [facility]   Culture     Final   Value:        BLOOD CULTURE RECEIVED NO GROWTH TO DATE CULTURE WILL BE HELD FOR 5 DAYS BEFORE  ISSUING A FINAL NEGATIVE REPORT     Performed at [facility]   Report Status PENDING   Incomplete  CULTURE, BLOOD (ROUTINE X 2)     Status: None   Collection Time    12/24/13  6:00 PM      Result Value Ref Range Status   Specimen Description BLOOD RIGHT HAND   Final   Special Requests BOTTLES DRAWN AEROBIC AND ANAEROBIC 10CC   Final   Culture  Setup Time     Final   Value: 12/24/2013 20:48     Performed at [facility]   Culture     Final   Value:        BLOOD CULTURE RECEIVED NO GROWTH TO DATE CULTURE WILL BE HELD FOR 5 DAYS BEFORE ISSUING A FINAL NEGATIVE REPORT     Performed at [facility]   Report Status PENDING   Incomplete  CLOSTRIDIUM DIFFICILE BY PCR     Status: None   Collection Time    12/24/13  6:50 PM      Result Value Ref Range Status   C difficile by pcr NEGATIVE  NEGATIVE Final   Comment: Performed at [facility]     Studies: Dg Chest 2 View  12/23/2013   CLINICAL DATA:  Fever  EXAM: CHEST  2 VIEW  COMPARISON:  None.  FINDINGS: Aortic valve replacement. Heart size and vascularity are normal. Negative for heart failure. Negative for pneumonia or effusion. Lungs are clear.  IMPRESSION: No active cardiopulmonary disease.   Electronically Signed   By: Marlan Palau M.D.   On: 12/23/2013 20:30   Ct Abdomen Pelvis W Contrast  12/24/2013   CLINICAL DATA:  Back injury neck hearing aid generalized weakness and abdominal pain.  EXAM: CT ABDOMEN AND PELVIS WITH CONTRAST  TECHNIQUE: Multidetector CT imaging of the abdomen and pelvis was performed using the standard protocol following bolus administration of intravenous contrast.  CONTRAST:  OMNIPAQUE IOHEXOL 300 MG/ML  SOLN  COMPARISON:  None available for comparison at time of study interpretation.  FINDINGS: LUNG BASES: Included view of the lung bases are clear. Included heart size is normal, status post median sternotomy. Visualized pericardium is unremarkable.  SOLID ORGANS: The spleen,  pancreas and adrenal glands are unremarkable. Status post cholecystectomy. Thirteen calcified splenic aneurysm. Subcentimeter cyst in right lobe of the liver, which is otherwise unremarkable.  GASTROINTESTINAL TRACT: The stomach, small bowel are normal in course and caliber without inflammatory changes. Colonic interpositioning. Colonic diverticulosis with mild sigmoid wall thickening, no superimposed pericolonic inflammation. Normal appendix.  KIDNEYS/ URINARY TRACT: Kidneys are orthotopic, demonstrating symmetric enhancement. No nephrolithiasis, hydronephrosis or solid renal masses. The unopacified ureters are normal in course and caliber. Too small to characterize hypodensity lower pole right kidney. Delayed imaging through the kidneys demonstrates symmetric prompt contrast excretion within the proximal urinary collecting system. Urinary bladder is partially distended and unremarkable.  PERITONEUM/RETROPERITONEUM: No intraperitoneal free fluid nor free air. Aortoiliac vessels are normal in course and caliber. No lymphadenopathy by CT size criteria. Internal reproductive organs are unremarkable.  SOFT TISSUE/OSSEOUS STRUCTURES: Fat containing superior ventral hernia. Grade 1 L4-5 anterolisthesis without spondylolysis. No destructive bony lesions.  IMPRESSION: Colonic diverticulosis with sigmoid wall thickening favoring chronic diverticulitis without CT findings of acute diverticulitis though, recommend clinical correlation. No bowel obstruction.  Status post cholecystectomy.  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   Electronically Signed   By: Awilda Metro   On: 12/24/2013 17:44    Scheduled Meds: . aspirin EC  81 mg Oral Daily  . atenolol  50 mg Oral Daily  . atorvastatin  20 mg Oral q1800  . cefTRIAXone (ROCEPHIN)  IV  2 g Intravenous Q24H  . enoxaparin (LOVENOX) injection  40 mg Subcutaneous Q24H  . levothyroxine  100 mcg Oral QAC breakfast  . pantoprazole  40 mg Oral Daily  . sertraline  50  mg Oral Daily   Continuous Infusions:   Principal Problem:   Bacteremia due to Escherichia coli Active Problems:   Rheumatoid arthritis(714.0)   Cellulitis of lower extremity   Cellulitis of left leg   Fever chills   Thrombocytopenia, unspecified    Time spent: 40 MINS    [facility] MD [facility] Pager [phone]. If 7PM-7AM, please contact night-coverage at [email or web address], password [facility] 12/25/2013, 6:16 PM  LOS: 2 days

## 2013-12-25 NOTE — Progress Notes (Signed)
[facility]  Day # 1 rocephin  2 days of zosyn 1 day vancomycin   Subjective: No new complaints   Antibiotics:  Anti-infectives   Start     Dose/Rate Route Frequency Ordered Stop   12/25/13 1400  cefTRIAXone (ROCEPHIN) 2 g in dextrose 5 % 50 mL IVPB     2 g 100 mL/hr over 30 Minutes Intravenous Every 24 hours 12/25/13 1329     12/24/13 0600  vancomycin (VANCOCIN) 1,250 mg in sodium chloride 0.9 % 250 mL IVPB  Status:  Discontinued     1,250 mg 166.7 mL/hr over 90 Minutes Intravenous Every 12 hours 12/24/13 0514 12/24/13 1101   12/24/13 0600  piperacillin-tazobactam (ZOSYN) IVPB 3.375 g  Status:  Discontinued     3.375 g 12.5 mL/hr over 240 Minutes Intravenous 3 times per day 12/24/13 0514 12/25/13 1329   12/23/13 2200  hydroxychloroquine (PLAQUENIL) tablet 200 mg  Status:  Discontinued     200 mg Oral 2 times daily 12/23/13 1942 12/24/13 0915   12/23/13 2015  vancomycin (VANCOCIN) IVPB 1000 mg/200 mL premix     1,000 mg 200 mL/hr over 60 Minutes Intravenous  Once 12/23/13 2001 12/23/13 2145   12/23/13 2015  piperacillin-tazobactam (ZOSYN) IVPB 3.375 g     3.375 g 100 mL/hr over 30 Minutes Intravenous  Once 12/23/13 2001 12/23/13 2222   12/23/13 1730  clindamycin (CLEOCIN) IVPB 600 mg     600 mg 100 mL/hr over 30 Minutes Intravenous  Once 12/23/13 1722 12/23/13 1810      Medications: Scheduled Meds: . aspirin EC  81 mg Oral Daily  . atenolol  50 mg Oral Daily  . atorvastatin  20 mg Oral q1800  . cefTRIAXone (ROCEPHIN)  IV  2 g Intravenous Q24H  . enoxaparin (LOVENOX) injection  40 mg Subcutaneous Q24H  . levothyroxine  100 mcg Oral QAC breakfast  . pantoprazole  40 mg Oral Daily  . sertraline  50 mg Oral Daily   Continuous Infusions:  PRN Meds:.acetaminophen, acetaminophen, hydrOXYzine, loperamide, morphine injection, ondansetron (ZOFRAN) IV, ondansetron, oxyCODONE    Objective: Weight change:   Intake/Output Summary (Last 24  hours) at 12/25/13 1501 Last data filed at 12/25/13 1237  Gross per 24 hour  Intake      0 ml  Output      9 ml  Net     -9 ml   Blood pressure 138/61, pulse 65, temperature 98.9 F (37.2 C), temperature source Oral, resp. rate 18, height 5\' 3"  (1.6 m), weight 229 lb 11.5 oz (104.2 kg), SpO2 96.00%. Temp:  [98.7 F (37.1 C)-98.9 F (37.2 C)] 98.9 F (37.2 C) (08/04 0614) Pulse Rate:  [65-78] 65 (08/04 0614) Resp:  [18] 18 (08/04 0614) BP: (127-138)/(58-61) 138/61 mmHg (08/04 0614) SpO2:  [96 %-98 %] 96 % (08/04 08-13-2004)  Physical Exam: General: Alert and awake, oriented x3, not in any acute distress. HEENT: anicteric sclera, pupils reactive to light and accommodation, EOMI CVS regular rate, normal r, systolic click  no murmur rubs or gallops Chest: clear to auscultation bilaterally, no wheezing, rales or rhonchi Abdomen: soft nontender, nondistended, normal bowel sounds, Extremities: no  clubbing or edema noted bilaterally Skin: no rashes  No change in ulcerated area:       Neuro: nonfocal  CBC:  Recent Labs Lab 12/23/13 1540 12/24/13 0923 12/25/13 0450  HGB 11.0* 9.9* 10.0*  HCT 33.6* 29.7* 30.8*  PLT 120* 109* 127*  BMET  Recent Labs  12/24/13 0923 12/25/13 0450  NA 140 141  K 3.3* 3.5*  CL 103 105  CO2 24 25  GLUCOSE 134* 102*  BUN 12 9  CREATININE 0.76 0.98  CALCIUM 8.7 8.7     Liver Panel   Recent Labs  12/23/13 1540 12/25/13 0450  PROT 7.4 6.4  ALBUMIN 3.5 3.0*  AST 31 24  ALT 18 17  ALKPHOS 66 65  BILITOT 1.1 0.7       Sedimentation Rate No results found for this basename: ESRSEDRATE,  in the last 72 hours C-Reactive Protein No results found for this basename: CRP,  in the last 72 hours  Micro Results: Recent Results (from the past 720 hour(s))  URINE CULTURE     Status: None   Collection Time    12/23/13  4:00 PM      Result Value Ref Range Status   Specimen Description URINE, RANDOM   Final   Special Requests  NONE   Final   Culture  Setup Time     Final   Value: 12/24/2013 11:54     Performed at [facility] Count     Final   Value: 1,000 COLONIES/ML     Performed at [facility]   Culture     Final   Value: INSIGNIFICANT GROWTH     Performed at [facility]   Report Status 12/25/2013 FINAL   Final  CULTURE, BLOOD (ROUTINE X 2)     Status: None   Collection Time    12/23/13  5:08 PM      Result Value Ref Range Status   Specimen Description BLOOD RIGHT ANTECUBITAL   Final   Special Requests BOTTLES DRAWN AEROBIC AND ANAEROBIC 2.5CC EACH   Final   Culture  Setup Time     Final   Value: 12/23/2013 20:46     Performed at [facility]   Culture     Final   Value: ESCHERICHIA COLI     Note: Gram Stain Report Called to,Read Back By and Verified With: Verner Chol RN on 12/24/13 at 06:35 by Christie Nottingham     Performed at [facility]   Report Status PENDING   Incomplete  CULTURE, BLOOD (ROUTINE X 2)     Status: None   Collection Time    12/23/13  5:10 PM      Result Value Ref Range Status   Specimen Description BLOOD LEFT ANTECUBITAL   Final   Special Requests BOTTLES DRAWN AEROBIC AND ANAEROBIC 2.5CC EACH   Final   Culture  Setup Time     Final   Value: 12/23/2013 20:46     Performed at [facility]   Culture     Final   Value: ESCHERICHIA COLI     Note: Gram Stain Report Called to,Read Back By and Verified With: ASHTON MIRRITTE @ 6:11AM 8.3.15 BY DESIS     Performed at [facility]   Report Status PENDING   Incomplete  CULTURE, BLOOD (ROUTINE X 2)     Status: None   Collection Time    12/24/13  5:55 PM      Result Value Ref Range Status   Specimen Description BLOOD RIGHT ARM   Final   Special Requests BOTTLES DRAWN AEROBIC AND ANAEROBIC 10CC   Final   Culture  Setup Time     Final   Value: 12/24/2013 20:48  Performed at [facility]   Culture     Final   Value:        BLOOD CULTURE RECEIVED NO GROWTH TO  DATE CULTURE WILL BE HELD FOR 5 DAYS BEFORE ISSUING A FINAL NEGATIVE REPORT     [facility]formed [facility]   Report Status PENDING   Incomplete  CULTURE, BLOOD (ROUTINE X 2)     Status: None   Collection Time    12/24/13  6:00 PM      Result Value Ref Range Status   Specimen Description BLOOD RIGHT HAND   Final   Special Requests BOTTLES DRAWN AEROBIC AND ANAEROBIC 10CC   Final   Culture  Setup Time     Final   Value: 12/24/2013 20:48     [facility]formed at [facility]   Culture     Final   Value:        BLOOD CULTURE RECEIVED NO GROWTH TO DATE CULTURE WILL BE HELD FOR 5 DAYS BEFORE ISSUING A FINAL NEGATIVE REPORT     [facility]formed at [facility]   Report Status PENDING   Incomplete  CLOSTRIDIUM DIFFICILE BY PCR     Status: None   Collection Time    12/24/13  6:50 PM      Result Value Ref Range Status   C difficile by pcr NEGATIVE  NEGATIVE Final   Comment: [facility]    Studies/Results: Dg Chest 2 View  12/23/2013   CLINICAL DATA:  Fever  EXAM: CHEST  2 VIEW  COMPARISON:  None.  FINDINGS: Aortic valve replacement. Heart size and vascularity are normal. Negative for heart failure. Negative for pneumonia or effusion. Lungs are clear.  IMPRESSION: No active cardiopulmonary disease.   Electronically Signed   By: Marlan Palauharles  Clark M.D.   On: 12/23/2013 20:30   Ct Abdomen Pelvis W Contrast  12/24/2013   CLINICAL DATA:  Back injury neck hearing aid generalized weakness and abdominal pain.  EXAM: CT ABDOMEN AND PELVIS WITH CONTRAST  TECHNIQUE: Multidetector CT imaging of the abdomen and pelvis was performed using the standard protocol following bolus administration of intravenous contrast.  CONTRAST:  100mL OMNIPAQUE IOHEXOL 300 MG/ML  SOLN  COMPARISON:  None available for comparison at time of study interpretation.  FINDINGS: LUNG BASES: Included view of the lung bases are clear. Included heart size is normal, status post median sternotomy. Visualized  pericardium is unremarkable.  SOLID ORGANS: The spleen, pancreas and adrenal glands are unremarkable. Status post cholecystectomy. Thirteen calcified splenic aneurysm. Subcentimeter cyst in right lobe of the liver, which is otherwise unremarkable.  GASTROINTESTINAL TRACT: The stomach, small bowel are normal in course and caliber without inflammatory changes. Colonic interpositioning. Colonic diverticulosis with mild sigmoid wall thickening, no superimposed pericolonic inflammation. Normal appendix.  KIDNEYS/ URINARY TRACT: Kidneys are orthotopic, demonstrating symmetric enhancement. No nephrolithiasis, hydronephrosis or solid renal masses. The unopacified ureters are normal in course and caliber. Too small to characterize hypodensity lower pole right kidney. Delayed imaging through the kidneys demonstrates symmetric prompt contrast excretion within the proximal urinary collecting system. Urinary bladder is partially distended and unremarkable.  PERITONEUM/RETROPERITONEUM: No intraperitoneal free fluid nor free air. Aortoiliac vessels are normal in course and caliber. No lymphadenopathy by CT size criteria. Internal reproductive organs are unremarkable.  SOFT TISSUE/OSSEOUS STRUCTURES: Fat containing superior ventral hernia. Grade 1 L4-5 anterolisthesis without spondylolysis. No destructive bony lesions.  IMPRESSION: Colonic diverticulosis with sigmoid wall thickening favoring chronic diverticulitis without CT findings  of acute diverticulitis though, recommend clinical correlation. No bowel obstruction.  Status post cholecystectomy.  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   Electronically Signed   By: Awilda Metro   On: 12/24/2013 17:44      Assessment/Plan:  Principal Problem:   Bacteremia due to Gram-negative bacteria Active Problems:   Rheumatoid arthritis(714.0)   Cellulitis of lower extremity   Cellulitis of left leg   Fever chills    Holly Hooper is a 73 y.o. female with  female with  arthritis, aortic valve replacement admitted with fever SIRS, thought to be some random cellulitis some slight found to have gram-negative bacteremia with E coli (sensis pending)   #1 Gram-negative bacteremia:   CT scan showed thickening in the sigmoid colon c/w "chronic diverticulitis" which seems the most plausible source of infection.  --change to rocephin 2g daily --I will add back in anerobic coverage 500mg  tid   #2 thrombocytopenia: Likely due to her severe infection and is improving   #3 Screening: hep panel negative, HIV RNA pending    LOS: 2 days   12/25/2013, 3:01 PM

## 2013-12-26 LAB — BASIC METABOLIC PANEL
Anion gap: 11 (ref 5–15)
BUN: 9 mg/dL (ref 6–23)
CHLORIDE: 108 meq/L (ref 96–112)
CO2: 23 mEq/L (ref 19–32)
CREATININE: 0.72 mg/dL (ref 0.50–1.10)
Calcium: 8.8 mg/dL (ref 8.4–10.5)
GFR, EST NON AFRICAN AMERICAN: 85 mL/min — AB (ref >90)
Glucose, Bld: 103 mg/dL — ABNORMAL HIGH (ref 70–99)
POTASSIUM: 3.8 meq/L (ref 3.7–5.3)
Sodium: 142 mEq/L (ref 137–147)

## 2013-12-26 LAB — CULTURE, BLOOD (ROUTINE X 2)

## 2013-12-26 LAB — CBC WITH DIFFERENTIAL/PLATELET
BASOS PCT: 0 % (ref 0–1)
Basophils Absolute: 0 10*3/uL (ref 0.0–0.1)
EOS ABS: 0.1 10*3/uL (ref 0.0–0.7)
Eosinophils Relative: 2 % (ref 0–5)
HCT: 29.8 % — ABNORMAL LOW (ref 36.0–46.0)
HEMOGLOBIN: 9.7 g/dL — AB (ref 12.0–15.0)
Lymphocytes Relative: 26 % (ref 12–46)
Lymphs Abs: 1.4 10*3/uL (ref 0.7–4.0)
MCH: 30.4 pg (ref 26.0–34.0)
MCHC: 32.6 g/dL (ref 30.0–36.0)
MCV: 93.4 fL (ref 78.0–100.0)
Monocytes Absolute: 0.6 10*3/uL (ref 0.1–1.0)
Monocytes Relative: 11 % (ref 3–12)
NEUTROS PCT: 61 % (ref 43–77)
Neutro Abs: 3.3 10*3/uL (ref 1.7–7.7)
Platelets: 147 10*3/uL — ABNORMAL LOW (ref 150–400)
RBC: 3.19 MIL/uL — AB (ref 3.87–5.11)
RDW: 13.7 % (ref 11.5–15.5)
WBC: 5.5 10*3/uL (ref 4.0–10.5)

## 2013-12-26 MED ORDER — FAMOTIDINE 20 MG PO TABS
20.0000 mg | ORAL_TABLET | Freq: Every day | ORAL | Status: DC | PRN
  Filled 2013-12-26: qty 1

## 2013-12-26 MED ORDER — CELECOXIB 200 MG PO CAPS
200.0000 mg | ORAL_CAPSULE | Freq: Every day | ORAL | Status: DC
  Administered 2013-12-26 – 2013-12-27 (×2): 200 mg via ORAL
  Filled 2013-12-26 (×2): qty 1

## 2013-12-26 MED ORDER — LEVOFLOXACIN 500 MG PO TABS
500.0000 mg | ORAL_TABLET | Freq: Every day | ORAL | Status: DC
  Administered 2013-12-27: 500 mg via ORAL
  Filled 2013-12-26: qty 1

## 2013-12-26 MED ORDER — METRONIDAZOLE 500 MG PO TABS
500.0000 mg | ORAL_TABLET | Freq: Three times a day (TID) | ORAL | Status: DC
  Administered 2013-12-26 – 2013-12-27 (×3): 500 mg via ORAL
  Filled 2013-12-26 (×6): qty 1

## 2013-12-26 NOTE — Care Management Note (Unsigned)
    Page 1 of 1   12/26/2013     4:01:03 PM CARE MANAGEMENT NOTE 12/26/2013  Patient:  Holly Hooper, Holly Hooper   Account Number:  [id number]  Date Initiated:  12/26/2013  Documentation initiated by:  [facility]  Subjective/Objective Assessment:   73 year old female admitted with cellulitis.     Action/Plan:   From home with husband.   Anticipated DC Date:  12/29/2013   Anticipated DC Plan:  HOME/[place] [facility]      DC Planning Services  CM consult      Choice offered to / List presented to:             Status of service:  Completed, signed off Medicare Important Message given?  YES (If response is "NO", the following Medicare IM given date fields will be blank) Date Medicare IM given:  12/26/2013 Medicare IM given by:  [facility] Date Additional Medicare IM given:   Additional Medicare IM given by:    Discharge Disposition:    Per UR Regulation:  Reviewed for med. necessity/level of care/duration of stay  If discussed at Long Length of Stay Meetings, dates discussed:    Comments:

## 2013-12-26 NOTE — Progress Notes (Signed)
Patient ID: Holly Hooper, female   DOB: 1941-01-15, 73 y.o.   MRN: [id number]  [facility] PROGRESS NOTE  Holly Hooper [id number] DOB: 04/06/1941 DOA: 12/23/2013 PCP: No primary provider on file.  Brief narrative: 73 y.o. female with prior h/o hypertension, RA, anemia, h/o aortic valve replacement two years ago, comes in for a bug bite two days ago , followed by fever and chills yesterday. She presents to ED for the above complaints. Also reports occasional diarrhea. On arrival to ED, she was found to be febrile , no leukocytosis. She is referred to [facility] for an admission.  Principal Problem:   Bacteremia due to Escherichia coli - ? Chronic diverticulitis as potential source of the infection - currently on Flagyl and Levaquin and will need to continue until August 16th, 2015 -- pt should have set of blood cultures from 2 sites TWO WEEKS AFTER FINISHING HER ABX at PCP in [place]  Active Problems:   Rheumatoid arthritis - continue Celebrex as per home regimen    Cellulitis of lower extremity - ABX as noted above    Thrombocytopenia - Plt' stable and trending up - no signs of active bleeding - repeat CBC in AM   Hypokalemia - supplemented and WNL this AM - repeat BMP in AM   Anemia of chronic disease - Hg stable, no signs of active bleeding  - repeat CBC in AM   HTN - continue Atenolol   Hypothyroidism  - continue Synthroid   Consultants:  ID  Procedures/Studies: Ct Abdomen Pelvis W Contrast   12/24/2013  Colonic diverticulosis with sigmoid wall thickening favoring chronic diverticulitis without CT findings of acute diverticulitis though, recommend clinical correlation. No bowel obstruction.  Status post cholecystectomy.  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    Antibiotics:  Metronidazole  Levaquin   Code Status: Full Family Communication: Pt at bedside Disposition Plan: Home likely in AM  HPI/Subjective: No events overnight.   Objective: Filed Vitals:   12/25/13 0614 12/25/13 2117 12/26/13 0611 12/26/13 1321  BP: 138/61 145/67 153/70 137/74  Pulse: 65 76 78 64  Temp: 98.9 F (37.2 C) 97.7 F (36.5 C) 98.5 F (36.9 C) 98.2 F (36.8 C)  TempSrc: Oral Oral Oral Oral  Resp: 18 20 18 18   Height:      Weight:      SpO2: 96% 100% 97% 98%    Intake/Output Summary (Last 24 hours) at 12/26/13 1911 Last data filed at 12/26/13 1230  Gross per 24 hour  Intake    540 ml  Output      0 ml  Net    540 ml   Exam:   General:  Pt is alert, follows commands appropriately, not in acute distress  Cardiovascular: Regular rate and rhythm, S1/S2, no murmurs, no rubs, no gallops  Respiratory: Clear to auscultation bilaterally, no wheezing, no crackles, no rhonchi  Abdomen: Soft, non tender, non distended, bowel sounds present, no guarding  Extremities: No edema, pulses DP and PT palpable bilaterally  Neuro: Grossly nonfocal  Data Reviewed: Basic Metabolic Panel:  Recent Labs Lab 12/23/13 1540 12/24/13 0923 12/25/13 0450 12/26/13 0510  NA 138 140 141 142  K 3.7 3.3* 3.5* 3.8  CL 101 103 105 108  CO2 22 24 25 23   GLUCOSE 131* 134* 102* 103*  BUN 16 12 9 9   CREATININE 0.76 0.76 0.98 0.72  CALCIUM 9.1 8.7 8.7 8.8   Liver Function Tests:  Recent Labs Lab 12/23/13 1540 12/25/13 0450  AST 31  24  ALT 18 17  ALKPHOS 66 65  BILITOT 1.1 0.7  PROT 7.4 6.4  ALBUMIN 3.5 3.0*   CBC:  Recent Labs Lab 12/23/13 1540 12/24/13 0923 12/25/13 0450 12/26/13 0510  WBC 5.4 5.2 4.2 5.5  NEUTROABS  --  4.4 2.7 3.3  HGB 11.0* 9.9* 10.0* 9.7*  HCT 33.6* 29.7* 30.8* 29.8*  MCV 93.3 92.0 93.3 93.4  PLT 120* 109* 127* 147*   Recent Results (from the past 240 hour(s))  URINE CULTURE     Status: None   Collection Time    12/23/13  4:00 PM      Result Value Ref Range Status   Specimen Description URINE, RANDOM   Final   Special Requests NONE   Final   Culture  Setup Time     Final   Value: 12/24/2013 11:54     Performed at [facility] Count     Final   Value: 1,000 COLONIES/ML     Performed at [facility]   Culture     Final   Value: INSIGNIFICANT GROWTH     Performed at [facility]   Report Status 12/25/2013 FINAL   Final  CULTURE, BLOOD (ROUTINE X 2)     Status: None   Collection Time    12/23/13  5:08 PM      Result Value Ref Range Status   Specimen Description BLOOD RIGHT ANTECUBITAL   Final   Special Requests BOTTLES DRAWN AEROBIC AND ANAEROBIC 2.5CC EACH   Final   Culture  Setup Time     Final   Value: 12/23/2013 20:46     Performed at [facility]   Culture     Final   Value: ESCHERICHIA COLI     Note: Gram Stain Report Called to,Read Back By and Verified With: Verner Chol RN on 12/24/13 at 06:35 by Christie Nottingham     Performed at [facility]   Report Status 12/26/2013 FINAL   Final   Organism ID, Bacteria ESCHERICHIA COLI   Final  CULTURE, BLOOD (ROUTINE X 2)     Status: None   Collection Time    12/23/13  5:10 PM      Result Value Ref Range Status   Specimen Description BLOOD LEFT ANTECUBITAL   Final   Special Requests BOTTLES DRAWN AEROBIC AND ANAEROBIC 2.5CC EACH   Final   Culture  Setup Time     Final   Value: 12/23/2013 20:46     Performed at [facility]   Culture     Final   Value: ESCHERICHIA COLI     Note: SUSCEPTIBILITIES PERFORMED ON PREVIOUS CULTURE WITHIN THE LAST 5 DAYS.     Note: Gram Stain Report Called to,Read Back By and Verified With: ASHTON MIRRITTE @ 6:11AM 8.3.15 BY DESIS     Performed at [facility]   Report Status 12/26/2013 FINAL   Final  CULTURE, BLOOD (ROUTINE X 2)     Status: None   Collection Time    12/24/13  5:55 PM      Result Value Ref Range Status   Specimen Description BLOOD RIGHT ARM   Final   Special Requests BOTTLES DRAWN AEROBIC AND ANAEROBIC 10CC   Final   Culture  Setup Time     Final   Value: 12/24/2013 20:48     Performed at [facility]   Culture     Final   Value:  BLOOD CULTURE RECEIVED NO GROWTH TO DATE CULTURE WILL BE HELD FOR 5 DAYS BEFORE ISSUING A FINAL NEGATIVE REPORT     Performed at [facility]   Report Status PENDING   Incomplete  CULTURE, BLOOD (ROUTINE X 2)     Status: None   Collection Time    12/24/13  6:00 PM      Result Value Ref Range Status   Specimen Description BLOOD RIGHT HAND   Final   Special Requests BOTTLES DRAWN AEROBIC AND ANAEROBIC 10CC   Final   Culture  Setup Time     Final   Value: 12/24/2013 20:48     Performed at [facility]   Culture     Final   Value:        BLOOD CULTURE RECEIVED NO GROWTH TO DATE CULTURE WILL BE HELD FOR 5 DAYS BEFORE ISSUING A FINAL NEGATIVE REPORT     Performed at [facility]   Report Status PENDING   Incomplete  CLOSTRIDIUM DIFFICILE BY PCR     Status: None   Collection Time    12/24/13  6:50 PM      Result Value Ref Range Status   C difficile by pcr NEGATIVE  NEGATIVE Final   Comment: Performed at [facility]     Scheduled Meds: . aspirin EC  81 mg Oral Daily  . atenolol  50 mg Oral Daily  . atorvastatin  20 mg Oral q1800  . celecoxib  200 mg Oral Daily  . enoxaparin (LOVENOX) injection  40 mg Subcutaneous Q24H  . [START ON 12/27/2013] levofloxacin  500 mg Oral Daily  . levothyroxine  100 mcg Oral QAC breakfast  . metroNIDAZOLE  500 mg Oral 3 times per day  . sertraline  50 mg Oral Daily   Continuous Infusions:   Debbora Presto, MD  [facility] Pager [phone]  If 7PM-7AM, please contact night-coverage [email or web address] Password [email or web address] 12/26/2013, 7:11 PM   LOS: 3 days

## 2013-12-26 NOTE — Progress Notes (Signed)
[facility]  Day # 2 rocephin  2 days of zosyn 1 day vancomycin   Subjective: No new complaints    Antibiotics:  Anti-infectives   Start     Dose/Rate Route Frequency Ordered Stop   12/27/13 1000  levofloxacin (LEVAQUIN) tablet 500 mg     500 mg Oral Daily 12/26/13 1445 01/07/14 0959   12/26/13 1500  metroNIDAZOLE (FLAGYL) tablet 500 mg     500 mg Oral 3 times per day 12/26/13 1425     12/25/13 1400  cefTRIAXone (ROCEPHIN) 2 g in dextrose 5 % 50 mL IVPB  Status:  Discontinued     2 g 100 mL/hr over 30 Minutes Intravenous Every 24 hours 12/25/13 1329 12/26/13 1445   12/24/13 0600  vancomycin (VANCOCIN) 1,250 mg in sodium chloride 0.9 % 250 mL IVPB  Status:  Discontinued     1,250 mg 166.7 mL/hr over 90 Minutes Intravenous Every 12 hours 12/24/13 0514 12/24/13 1101   12/24/13 0600  piperacillin-tazobactam (ZOSYN) IVPB 3.375 g  Status:  Discontinued     3.375 g 12.5 mL/hr over 240 Minutes Intravenous 3 times per day 12/24/13 0514 12/25/13 1329   12/23/13 2200  hydroxychloroquine (PLAQUENIL) tablet 200 mg  Status:  Discontinued     200 mg Oral 2 times daily 12/23/13 1942 12/24/13 0915   12/23/13 2015  vancomycin (VANCOCIN) IVPB 1000 mg/200 mL premix     1,000 mg 200 mL/hr over 60 Minutes Intravenous  Once 12/23/13 2001 12/23/13 2145   12/23/13 2015  piperacillin-tazobactam (ZOSYN) IVPB 3.375 g     3.375 g 100 mL/hr over 30 Minutes Intravenous  Once 12/23/13 2001 12/23/13 2222   12/23/13 1730  clindamycin (CLEOCIN) IVPB 600 mg     600 mg 100 mL/hr over 30 Minutes Intravenous  Once 12/23/13 1722 12/23/13 1810      Medications: Scheduled Meds: . aspirin EC  81 mg Oral Daily  . atenolol  50 mg Oral Daily  . atorvastatin  20 mg Oral q1800  . celecoxib  200 mg Oral Daily  . enoxaparin (LOVENOX) injection  40 mg Subcutaneous Q24H  . [START ON 12/27/2013] levofloxacin  500 mg Oral Daily  . levothyroxine  100 mcg Oral QAC breakfast  . metroNIDAZOLE   500 mg Oral 3 times per day  . sertraline  50 mg Oral Daily   Continuous Infusions:  PRN Meds:.acetaminophen, acetaminophen, famotidine, hydrOXYzine, loperamide, morphine injection, ondansetron (ZOFRAN) IV, ondansetron, oxyCODONE    Objective: Weight change:   Intake/Output Summary (Last 24 hours) at 12/26/13 1446 Last data filed at 12/26/13 0831  Gross per 24 hour  Intake    240 ml  Output      2 ml  Net    238 ml   Blood pressure 137/74, pulse 64, temperature 98.2 F (36.8 C), temperature source Oral, resp. rate 18, height 5\' 3"  (1.6 m), weight 229 lb 11.5 oz (104.2 kg), SpO2 98.00%. Temp:  [97.7 F (36.5 C)-98.5 F (36.9 C)] 98.2 F (36.8 C) (08/05 1321) Pulse Rate:  [64-78] 64 (08/05 1321) Resp:  [18-20] 18 (08/05 1321) BP: (137-153)/(67-74) 137/74 mmHg (08/05 1321) SpO2:  [97 %-100 %] 98 % (08/05 1321)  Physical Exam: General: Alert and awake, oriented x3, not in any acute distress. HEENT: anicteric sclera, pupils reactive to light and accommodation, EOMI CVS regular rate, normal r, systolic click  no murmur rubs or gallops Chest: clear to auscultation bilaterally, no wheezing, rales or rhonchi Abdomen: soft  nontender, nondistended, normal bowel sounds, Extremities: no  clubbing or edema noted bilaterally Skin: no rashes  No change in ulcerated area:       Neuro: nonfocal  CBC:  Recent Labs Lab 12/23/13 1540 12/24/13 0923 12/25/13 0450 12/26/13 0510  HGB 11.0* 9.9* 10.0* 9.7*  HCT 33.6* 29.7* 30.8* 29.8*  PLT 120* 109* 127* 147*     BMET  Recent Labs  12/25/13 0450 12/26/13 0510  NA 141 142  K 3.5* 3.8  CL 105 108  CO2 25 23  GLUCOSE 102* 103*  BUN 9 9  CREATININE 0.98 0.72  CALCIUM 8.7 8.8     Liver Panel   Recent Labs  12/23/13 1540 12/25/13 0450  PROT 7.4 6.4  ALBUMIN 3.5 3.0*  AST 31 24  ALT 18 17  ALKPHOS 66 65  BILITOT 1.1 0.7       Sedimentation Rate No results found for this basename: ESRSEDRATE,  in the  last 72 hours C-Reactive Protein No results found for this basename: CRP,  in the last 72 hours  Micro Results: Recent Results (from the past 720 hour(s))  URINE CULTURE     Status: None   Collection Time    12/23/13  4:00 PM      Result Value Ref Range Status   Specimen Description URINE, RANDOM   Final   Special Requests NONE   Final   Culture  Setup Time     Final   Value: 12/24/2013 11:54     Performed at [facility] Count     Final   Value: 1,000 COLONIES/ML     Performed at [facility]   Culture     Final   Value: INSIGNIFICANT GROWTH     Performed at [facility]   Report Status 12/25/2013 FINAL   Final  CULTURE, BLOOD (ROUTINE X 2)     Status: None   Collection Time    12/23/13  5:08 PM      Result Value Ref Range Status   Specimen Description BLOOD RIGHT ANTECUBITAL   Final   Special Requests BOTTLES DRAWN AEROBIC AND ANAEROBIC 2.5CC EACH   Final   Culture  Setup Time     Final   Value: 12/23/2013 20:46     Performed at [facility]   Culture     Final   Value: ESCHERICHIA COLI     Note: Gram Stain Report Called to,Read Back By and Verified With: Verner Chol RN on 12/24/13 at 06:35 by Christie Nottingham     Performed at [facility]   Report Status 12/26/2013 FINAL   Final   Organism ID, Bacteria ESCHERICHIA COLI   Final  CULTURE, BLOOD (ROUTINE X 2)     Status: None   Collection Time    12/23/13  5:10 PM      Result Value Ref Range Status   Specimen Description BLOOD LEFT ANTECUBITAL   Final   Special Requests BOTTLES DRAWN AEROBIC AND ANAEROBIC 2.5CC EACH   Final   Culture  Setup Time     Final   Value: 12/23/2013 20:46     Performed at [facility]   Culture     Final   Value: ESCHERICHIA COLI     Note: SUSCEPTIBILITIES PERFORMED ON PREVIOUS CULTURE WITHIN THE LAST 5 DAYS.     Note: Gram Stain Report Called to,Read Back By and Verified With: ASHTON MIRRITTE @ 6:11AM 8.3.15 BY DESIS  Performed at  [facility]   Report Status 12/26/2013 FINAL   Final  CULTURE, BLOOD (ROUTINE X 2)     Status: None   Collection Time    12/24/13  5:55 PM      Result Value Ref Range Status   Specimen Description BLOOD RIGHT ARM   Final   Special Requests BOTTLES DRAWN AEROBIC AND ANAEROBIC 10CC   Final   Culture  Setup Time     Final   Value: 12/24/2013 20:48     Performed at [facility]   Culture     Final   Value:        BLOOD CULTURE RECEIVED NO GROWTH TO DATE CULTURE WILL BE HELD FOR 5 DAYS BEFORE ISSUING A FINAL NEGATIVE REPORT     Performed at [facility]   Report Status PENDING   Incomplete  CULTURE, BLOOD (ROUTINE X 2)     Status: None   Collection Time    12/24/13  6:00 PM      Result Value Ref Range Status   Specimen Description BLOOD RIGHT HAND   Final   Special Requests BOTTLES DRAWN AEROBIC AND ANAEROBIC 10CC   Final   Culture  Setup Time     Final   Value: 12/24/2013 20:48     Performed at [facility]   Culture     Final   Value:        BLOOD CULTURE RECEIVED NO GROWTH TO DATE CULTURE WILL BE HELD FOR 5 DAYS BEFORE ISSUING A FINAL NEGATIVE REPORT     Performed at [facility]   Report Status PENDING   Incomplete  CLOSTRIDIUM DIFFICILE BY PCR     Status: None   Collection Time    12/24/13  6:50 PM      Result Value Ref Range Status   C difficile by pcr NEGATIVE  NEGATIVE Final   Comment: Performed at [facility]    Studies/Results: Ct Abdomen Pelvis W Contrast  12/24/2013   CLINICAL DATA:  Back injury neck hearing aid generalized weakness and abdominal pain.  EXAM: CT ABDOMEN AND PELVIS WITH CONTRAST  TECHNIQUE: Multidetector CT imaging of the abdomen and pelvis was performed using the standard protocol following bolus administration of intravenous contrast.  CONTRAST:  OMNIPAQUE IOHEXOL 300 MG/ML  SOLN  COMPARISON:  None available for comparison at time of study interpretation.  FINDINGS: LUNG BASES: Included view of the  lung bases are clear. Included heart size is normal, status post median sternotomy. Visualized pericardium is unremarkable.  SOLID ORGANS: The spleen, pancreas and adrenal glands are unremarkable. Status post cholecystectomy. Thirteen calcified splenic aneurysm. Subcentimeter cyst in right lobe of the liver, which is otherwise unremarkable.  GASTROINTESTINAL TRACT: The stomach, small bowel are normal in course and caliber without inflammatory changes. Colonic interpositioning. Colonic diverticulosis with mild sigmoid wall thickening, no superimposed pericolonic inflammation. Normal appendix.  KIDNEYS/ URINARY TRACT: Kidneys are orthotopic, demonstrating symmetric enhancement. No nephrolithiasis, hydronephrosis or solid renal masses. The unopacified ureters are normal in course and caliber. Too small to characterize hypodensity lower pole right kidney. Delayed imaging through the kidneys demonstrates symmetric prompt contrast excretion within the proximal urinary collecting system. Urinary bladder is partially distended and unremarkable.  PERITONEUM/RETROPERITONEUM: No intraperitoneal free fluid nor free air. Aortoiliac vessels are normal in course and caliber. No lymphadenopathy by CT size criteria. Internal reproductive organs are unremarkable.  SOFT TISSUE/OSSEOUS STRUCTURES: Fat containing superior ventral hernia. Grade 1 L4-5  anterolisthesis without spondylolysis. No destructive bony lesions.  IMPRESSION: Colonic diverticulosis with sigmoid wall thickening favoring chronic diverticulitis without CT findings of acute diverticulitis though, recommend clinical correlation. No bowel obstruction.  Status post cholecystectomy.  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   Electronically Signed   By: Awilda Metro   On: 12/24/2013 17:44      Assessment/Plan:  Principal Problem:   Bacteremia due to Escherichia coli Active Problems:   Rheumatoid arthritis(714.0)   Cellulitis of lower extremity   Cellulitis  of left leg   Fever chills   Thrombocytopenia, unspecified    Hannya Ketch is a 73 y.o. female with  female with arthritis, aortic valve replacement admitted with fever SIRS, thought to be some random cellulitis some slight found to have gram-negative bacteremia with E coli (sensis pending)   #1 Gram-negative bacteremia:   CT scan showed thickening in the sigmoid colon c/w "chronic diverticulitis" which seems the most plausible source of infection.  After extensive discussion with pt and husband weighing risks and benefits of IV rocephin vs oral FQ she and husband have decided to go with oral levaquin and flagyl  --therefore change to levaquin 500mg  daily (to be started tomorrow)  and continue through August 16th (last date) --continue flagyl 500mg  tid thru the 16th --pt should have set of blood cultures from 2 sites TWO WEEKS AFTER FINISHING HER ABX at PCP in [place]   #2 risk for CDI: dc PPI, use H2 blocker prn   #2 thrombocytopenia: Likely due to her severe infection and is  resolving   #3 Screening: hep panel negative, HIV RNA pending  Pt would like to be dc home and I will let Dr. Janee Morn know.    LOS: 3 days   Acey Lav 12/26/2013, 2:46 PM

## 2013-12-27 LAB — BASIC METABOLIC PANEL
ANION GAP: 11 (ref 5–15)
BUN: 13 mg/dL (ref 6–23)
CALCIUM: 9 mg/dL (ref 8.4–10.5)
CO2: 24 mEq/L (ref 19–32)
Chloride: 103 mEq/L (ref 96–112)
Creatinine, Ser: 0.88 mg/dL (ref 0.50–1.10)
GFR, EST AFRICAN AMERICAN: 75 mL/min — AB (ref >90)
GFR, EST NON AFRICAN AMERICAN: 65 mL/min — AB (ref >90)
Glucose, Bld: 118 mg/dL — ABNORMAL HIGH (ref 70–99)
Potassium: 3.9 mEq/L (ref 3.7–5.3)
SODIUM: 138 meq/L (ref 137–147)

## 2013-12-27 LAB — CBC
HCT: 30 % — ABNORMAL LOW (ref 36.0–46.0)
Hemoglobin: 9.9 g/dL — ABNORMAL LOW (ref 12.0–15.0)
MCH: 30.7 pg (ref 26.0–34.0)
MCHC: 33 g/dL (ref 30.0–36.0)
MCV: 92.9 fL (ref 78.0–100.0)
PLATELETS: 136 10*3/uL — AB (ref 150–400)
RBC: 3.23 MIL/uL — AB (ref 3.87–5.11)
RDW: 13.6 % (ref 11.5–15.5)
WBC: 6.6 10*3/uL (ref 4.0–10.5)

## 2013-12-27 LAB — HIV-1 RNA QUANT-NO REFLEX-BLD: HIV-1 RNA Quant, Log: <1.3 {Log} (ref <1.30)

## 2013-12-27 MED ORDER — LEVOFLOXACIN 500 MG PO TABS: 500.0000 mg | ORAL_TABLET | Freq: Every day | ORAL | Status: AC

## 2013-12-27 MED ORDER — OXYCODONE HCL 5 MG PO TABS: 5.0000 mg | ORAL_TABLET | ORAL | Status: AC | PRN

## 2013-12-27 MED ORDER — METRONIDAZOLE 500 MG PO TABS: 500.0000 mg | ORAL_TABLET | Freq: Three times a day (TID) | ORAL | Status: AC

## 2013-12-27 MED ORDER — SACCHAROMYCES BOULARDII 250 MG PO CAPS: 250.0000 mg | ORAL_CAPSULE | Freq: Two times a day (BID) | ORAL | Status: AC

## 2013-12-27 NOTE — Progress Notes (Signed)
Discharge instructions and medications reviewed with patient and spouse. Patient and spouse verbalize understanding and have no questions at this time. Patient confirms she has all personal belongings in possession. Patient discharged home.

## 2013-12-27 NOTE — Discharge Instructions (Signed)
Bacteremia °Bacteremia occurs when bacteria get in your blood. Normal blood does not usually have bacteria. Bacteremia is one way infections can spread from one part of the body to another. °CAUSES  °· Causes may include anything that allows bacteria to get into the body. Examples are: °¨ Catheters. °¨ Intravenous (IV) access tubes. °¨ Cuts or scrapes of the skin. °· Temporary bacteremia may occur during dental procedures, while brushing your teeth, or during a bowel movement. This rarely causes any symptoms or medical problems. °· Bacteria may also get in the bloodstream as a complication of a bacterial infection elsewhere. This includes infected wounds and bacterial infections of the: °¨ Lungs (pneumonia). °¨ Kidneys (pyelonephritis). °¨ Intestines (enteritis, colitis). °¨ Organs in the abdomen (appendicitis, cholecystitis, diverticulitis). °SYMPTOMS  °The body is usually able to clear small numbers of bacteria out of the blood quickly. Brief bacteremia usually does not cause problems.  °· Problems can occur if the bacteria start to grow in number or spread to other parts of the body. If the bacteria start growing, you may develop: °¨ Chills. °¨ Fever. °¨ Nausea. °¨ Vomiting. °¨ Sweating. °¨ Lightheadedness and low blood pressure. °¨ Pain. °· If bacteria start to grow in the linings around the brain, it is called meningitis. This can cause severe headaches, many other problems, and even death. °· If bacteria start to grow in a joint, it causes arthritis with painful joints. If bacteria start to grow in a bone, it is called osteomyelitis. °· Bacteria from the blood can also cause sores (abscesses) in many organs, such as the muscle, liver, spleen, lungs, brain, and kidneys. °DIAGNOSIS  °· This condition is diagnosed by cultures of the blood. °· Cultures may also be taken from other parts of the body that are thought to be causing the bacteremia. A small piece of tissue, fluid, or other product of the body is  sampled. The sample is then put on a growth plate to see if any bacteria grows. °· Other lab tests may be done and the results may be abnormal. °TREATMENT  °Treatment requires a stay in the hospital. You will be given antibiotic medicine through an IV access tube. °PREVENTION  °People with an increased risk of developing bacteremia or complications may be given antibiotics before certain procedures. Examples are: °· A person with a heart murmur or artificial heart valve, before having his or her teeth cleaned. °· Before having a surgical or other invasive procedure. °· Before having a bowel procedure. °Document Released: 02/21/2006 Document Revised: 08/02/2011 Document Reviewed: 12/03/2010 °[employer]® Patient Information ©2015 [employer]. This information is not intended to replace advice given to you by your health care provider. Make sure you discuss any questions you have with your health care provider. ° °

## 2013-12-27 NOTE — Discharge Summary (Signed)
Physician Discharge Summary  [place] Oborn OZD:[id number] DOB: [birth year] DOA: 12/23/2013  PCP: No primary provider on file.  Admit date: 12/23/2013 Discharge date: 12/27/2013  Recommendations for Outpatient Follow-up:  1. Pt will need to follow up with PCP in 2-3 weeks post discharge 2. Please obtain BMP to evaluate electrolytes and kidney function 3. Please also check CBC to evaluate Hg and Hct levels 4. Continue Flagyl and Levaquin until August 16 th, 2015 per ID recommendations  5. Pt advised that she should have set of blood cultures from 2 sites TWO WEEKS AFTER FINISHING HER ABX at PCP in [place]   Discharge Diagnoses:  Principal Problem:   Bacteremia due to Escherichia coli Active Problems:   Rheumatoid arthritis(714.0)   Cellulitis of lower extremity   Cellulitis of left leg   Fever chills   Thrombocytopenia, unspecified  Discharge Condition: Stable  Diet recommendation: Heart healthy diet discussed in details   Brief narrative:  73 y.o. female with prior h/o hypertension, RA, anemia, h/o aortic valve replacement two years ago, comes in for a bug bite two days ago , followed by fever and chills yesterday. She presents to ED for the above complaints. Also reports occasional diarrhea. On arrival to ED, she was found to be febrile , no leukocytosis. She is referred to [facility] for an admission.   Principal Problem:  Bacteremia due to Escherichia coli  - ? Chronic diverticulitis as potential source of the infection  - currently on Flagyl and Levaquin and will need to continue until August 16th, 2015  -- pt should have set of blood cultures from 2 sites TWO WEEKS AFTER FINISHING HER ABX at PCP in [place]  Active Problems:  Rheumatoid arthritis  - continue Celebrex as per home regimen  Cellulitis of lower extremity  - ABX as noted above  Thrombocytopenia  - Plt' stable and trending up  - no signs of active bleeding  Hypokalemia  - supplemented and WNL this AM  Anemia of chronic  disease  - Hg stable, no signs of active bleeding  HTN - continue Atenolol  Hypothyroidism  - continue Synthroid   Consultants:  ID Procedures/Studies:  Ct Abdomen Pelvis W Contrast 12/24/2013 Colonic diverticulosis with sigmoid wall thickening favoring chronic diverticulitis without CT findings of acute diverticulitis though, recommend clinical correlation. No bowel obstruction. Status post cholecystectomy. 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  Antibiotics:  Metronidazole  Levaquin   Code Status: Full  Family Communication: Pt at bedside  Disposition Plan: Home   Discharge Exam: Filed Vitals:   12/27/13 0548  BP: 123/51  Pulse: 72  Temp: 98.2 F (36.8 C)  Resp: 18   Filed Vitals:   12/26/13 0611 12/26/13 1321 12/26/13 2145 12/27/13 0548  BP: 153/70 137/74 139/69 123/51  Pulse: 78 64 72 72  Temp: 98.5 F (36.9 C) 98.2 F (36.8 C) 98.3 F (36.8 C) 98.2 F (36.8 C)  TempSrc: Oral Oral Oral Oral  Resp: 18 18 16 18   Height:      Weight:      SpO2: 97% 98% 99% 96%    General: Pt is alert, follows commands appropriately, not in acute distress Cardiovascular: Regular rate and rhythm, S1/S2 +, no murmurs, no rubs, no gallops Respiratory: Clear to auscultation bilaterally, no wheezing, no crackles, no rhonchi Abdominal: Soft, non tender, non distended, bowel sounds +, no guarding Extremities: no edema, no cyanosis, pulses palpable bilaterally DP and PT Neuro: Grossly nonfocal  Discharge Instructions  Discharge Instructions   Diet - low  sodium heart healthy    Complete by:  As directed      Increase activity slowly    Complete by:  As directed             Medication List         alendronate 70 MG tablet  Commonly known as:  FOSAMAX  Take 70 mg by mouth every Sunday. Take with a full glass of water on an empty stomach.     aspirin EC 81 MG tablet  Take 81 mg by mouth daily.     atenolol 50 MG tablet  Commonly known as:  TENORMIN  Take 50 mg by mouth  daily.     calcium carbonate 600 MG Tabs tablet  Commonly known as:  OS-CAL  Take 600 mg by mouth 2 (two) times daily with a meal.     celecoxib 200 MG capsule  Commonly known as:  CELEBREX  Take 200 mg by mouth daily.     cholecalciferol 1000 UNITS tablet  Commonly known as:  VITAMIN D  Take 1,000 Units by mouth daily.     ferrous sulfate 325 (65 FE) MG tablet  Take 325 mg by mouth daily with breakfast.     Fish Oil 500 MG Caps  Take 500 mg by mouth 2 (two) times daily.     folic acid 1 MG tablet  Commonly known as:  FOLVITE  Take 1 mg by mouth daily.     hydroxychloroquine 200 MG tablet  Commonly known as:  PLAQUENIL  Take 200 mg by mouth 2 (two) times daily.     levofloxacin 500 MG tablet  Commonly known as:  LEVAQUIN  Take 1 tablet (500 mg total) by mouth daily. Continue taking until August 16th, 2015     levothyroxine 100 MCG tablet  Commonly known as:  SYNTHROID, LEVOTHROID  Take 100 mcg by mouth daily before breakfast.     losartan 50 MG tablet  Commonly known as:  COZAAR  Take 50 mg by mouth daily.     methotrexate 2.5 MG tablet  Commonly known as:  RHEUMATREX  Take 15 mg by mouth every Thursday. Caution:Chemotherapy. Protect from light.     metroNIDAZOLE 500 MG tablet  Commonly known as:  FLAGYL  Take 1 tablet (500 mg total) by mouth every 8 (eight) hours. Continue taking until August 16th, 2105     omeprazole 20 MG capsule  Commonly known as:  PRILOSEC  Take 20 mg by mouth daily.     oxyCODONE 5 MG immediate release tablet  Commonly known as:  Oxy IR/ROXICODONE  Take 1 tablet (5 mg total) by mouth every 4 (four) hours as needed for moderate pain.     rosuvastatin 10 MG tablet  Commonly known as:  CRESTOR  Take 10 mg by mouth daily.     saccharomyces boulardii 250 MG capsule  Commonly known as:  FLORASTOR  Take 1 capsule (250 mg total) by mouth 2 (two) times daily.     sertraline 50 MG tablet  Commonly known as:  ZOLOFT  Take 50 mg by mouth  daily.           Follow-up Information   Follow up with Debbora Presto, MD. (As needed, call my cell phone [phone])    Specialty:  Internal Medicine   Contact information:   [address] [place] [address] [phone]        The results of significant diagnostics from this hospitalization (including imaging, microbiology,  ancillary and laboratory) are listed below for reference.     Microbiology: Recent Results (from the past 240 hour(s))  URINE CULTURE     Status: None   Collection Time    12/23/13  4:00 PM      Result Value Ref Range Status   Specimen Description URINE, RANDOM   Final   Special Requests NONE   Final   Culture  Setup Time     Final   Value: 12/24/2013 11:54     Performed at [facility]   Colony Count     Final   Value: 1,000 COLONIES/ML     Performed at [facility]   Culture     Final   Value: INSIGNIFICANT GROWTH     Performed at [facility]   Report Status 12/25/2013 FINAL   Final  CULTURE, BLOOD (ROUTINE X 2)     Status: None   Collection Time    12/23/13  5:08 PM      Result Value Ref Range Status   Specimen Description BLOOD RIGHT ANTECUBITAL   Final   Special Requests BOTTLES DRAWN AEROBIC AND ANAEROBIC 2.5CC EACH   Final   Culture  Setup Time     Final   Value: 12/23/2013 20:46     Performed at [facility]   Culture     Final   Value: ESCHERICHIA COLI     Note: Gram Stain Report Called to,Read Back By and Verified With: Verner Chol RN on 12/24/13 at 06:35 by Christie Nottingham     Performed at [facility]   Report Status 12/26/2013 FINAL   Final   Organism ID, Bacteria ESCHERICHIA COLI   Final  CULTURE, BLOOD (ROUTINE X 2)     Status: None   Collection Time    12/23/13  5:10 PM      Result Value Ref Range Status   Specimen Description BLOOD LEFT ANTECUBITAL   Final   Special Requests BOTTLES DRAWN AEROBIC AND ANAEROBIC 2.5CC EACH   Final   Culture  Setup Time     Final    Value: 12/23/2013 20:46     Performed at [facility]   Culture     Final   Value: ESCHERICHIA COLI     Note: SUSCEPTIBILITIES PERFORMED ON PREVIOUS CULTURE WITHIN THE LAST 5 DAYS.     Note: Gram Stain Report Called to,Read Back By and Verified With: ASHTON MIRRITTE @ 6:11AM 8.3.15 BY DESIS     Performed at [facility]   Report Status 12/26/2013 FINAL   Final  CULTURE, BLOOD (ROUTINE X 2)     Status: None   Collection Time    12/24/13  5:55 PM      Result Value Ref Range Status   Specimen Description BLOOD RIGHT ARM   Final   Special Requests BOTTLES DRAWN AEROBIC AND ANAEROBIC 10CC   Final   Culture  Setup Time     Final   Value: 12/24/2013 20:48     Performed at [facility]   Culture     Final   Value:        BLOOD CULTURE RECEIVED NO GROWTH TO DATE CULTURE WILL BE HELD FOR 5 DAYS BEFORE ISSUING A FINAL NEGATIVE REPORT     Performed at [facility]   Report Status PENDING   Incomplete  CULTURE, BLOOD (ROUTINE X 2)     Status: None   Collection Time  12/24/13  6:00 PM      Result Value Ref Range Status   Specimen Description BLOOD RIGHT HAND   Final   Special Requests BOTTLES DRAWN AEROBIC AND ANAEROBIC 10CC   Final   Culture  Setup Time     Final   Value: 12/24/2013 20:48     Performed at [facility]   Culture     Final   Value:        BLOOD CULTURE RECEIVED NO GROWTH TO DATE CULTURE WILL BE HELD FOR 5 DAYS BEFORE ISSUING A FINAL NEGATIVE REPORT     Performed at [facility]   Report Status PENDING   Incomplete  CLOSTRIDIUM DIFFICILE BY PCR     Status: None   Collection Time    12/24/13  6:50 PM      Result Value Ref Range Status   C difficile by pcr NEGATIVE  NEGATIVE Final   Comment: Performed at [facility]     Labs: Basic Metabolic Panel:  Recent Labs Lab 12/23/13 1540 12/24/13 0923 12/25/13 0450 12/26/13 0510 12/27/13 0013  NA 138 140 141 142 138  K 3.7 3.3* 3.5* 3.8 3.9  CL 101 103 105 108 103   CO2 22 24 25 23 24   GLUCOSE 131* 134* 102* 103* 118*  BUN 16 12 9 9 13   CREATININE 0.76 0.76 0.98 0.72 0.88  CALCIUM 9.1 8.7 8.7 8.8 9.0   Liver Function Tests:  Recent Labs Lab 12/23/13 1540 12/25/13 0450  AST 31 24  ALT 18 17  ALKPHOS 66 65  BILITOT 1.1 0.7  PROT 7.4 6.4  ALBUMIN 3.5 3.0*   CBC:  Recent Labs Lab 12/23/13 1540 12/24/13 0923 12/25/13 0450 12/26/13 0510 12/27/13 0013  WBC 5.4 5.2 4.2 5.5 6.6  NEUTROABS  --  4.4 2.7 3.3  --   HGB 11.0* 9.9* 10.0* 9.7* 9.9*  HCT 33.6* 29.7* 30.8* 29.8* 30.0*  MCV 93.3 92.0 93.3 93.4 92.9  PLT 120* 109* 127* 147* 136*   SIGNED: Time coordinating discharge: Over 30 minutes  Debbora Presto, MD  [facility] 12/27/2013, 9:53 AM Pager [phone]  If 7PM-7AM, please contact night-coverage [email or web address] Password [email or web address]

## 2013-12-30 LAB — CULTURE, BLOOD (ROUTINE X 2)
CULTURE: NO GROWTH
CULTURE: NO GROWTH

## 2015-12-22 IMAGING — CR DG CHEST 2V
2 series · 2 of 2 positions shown · non-contrast
Comparison: None.

CLINICAL DATA: Fever

EXAM:
CHEST  2 VIEW

[w chest pa]
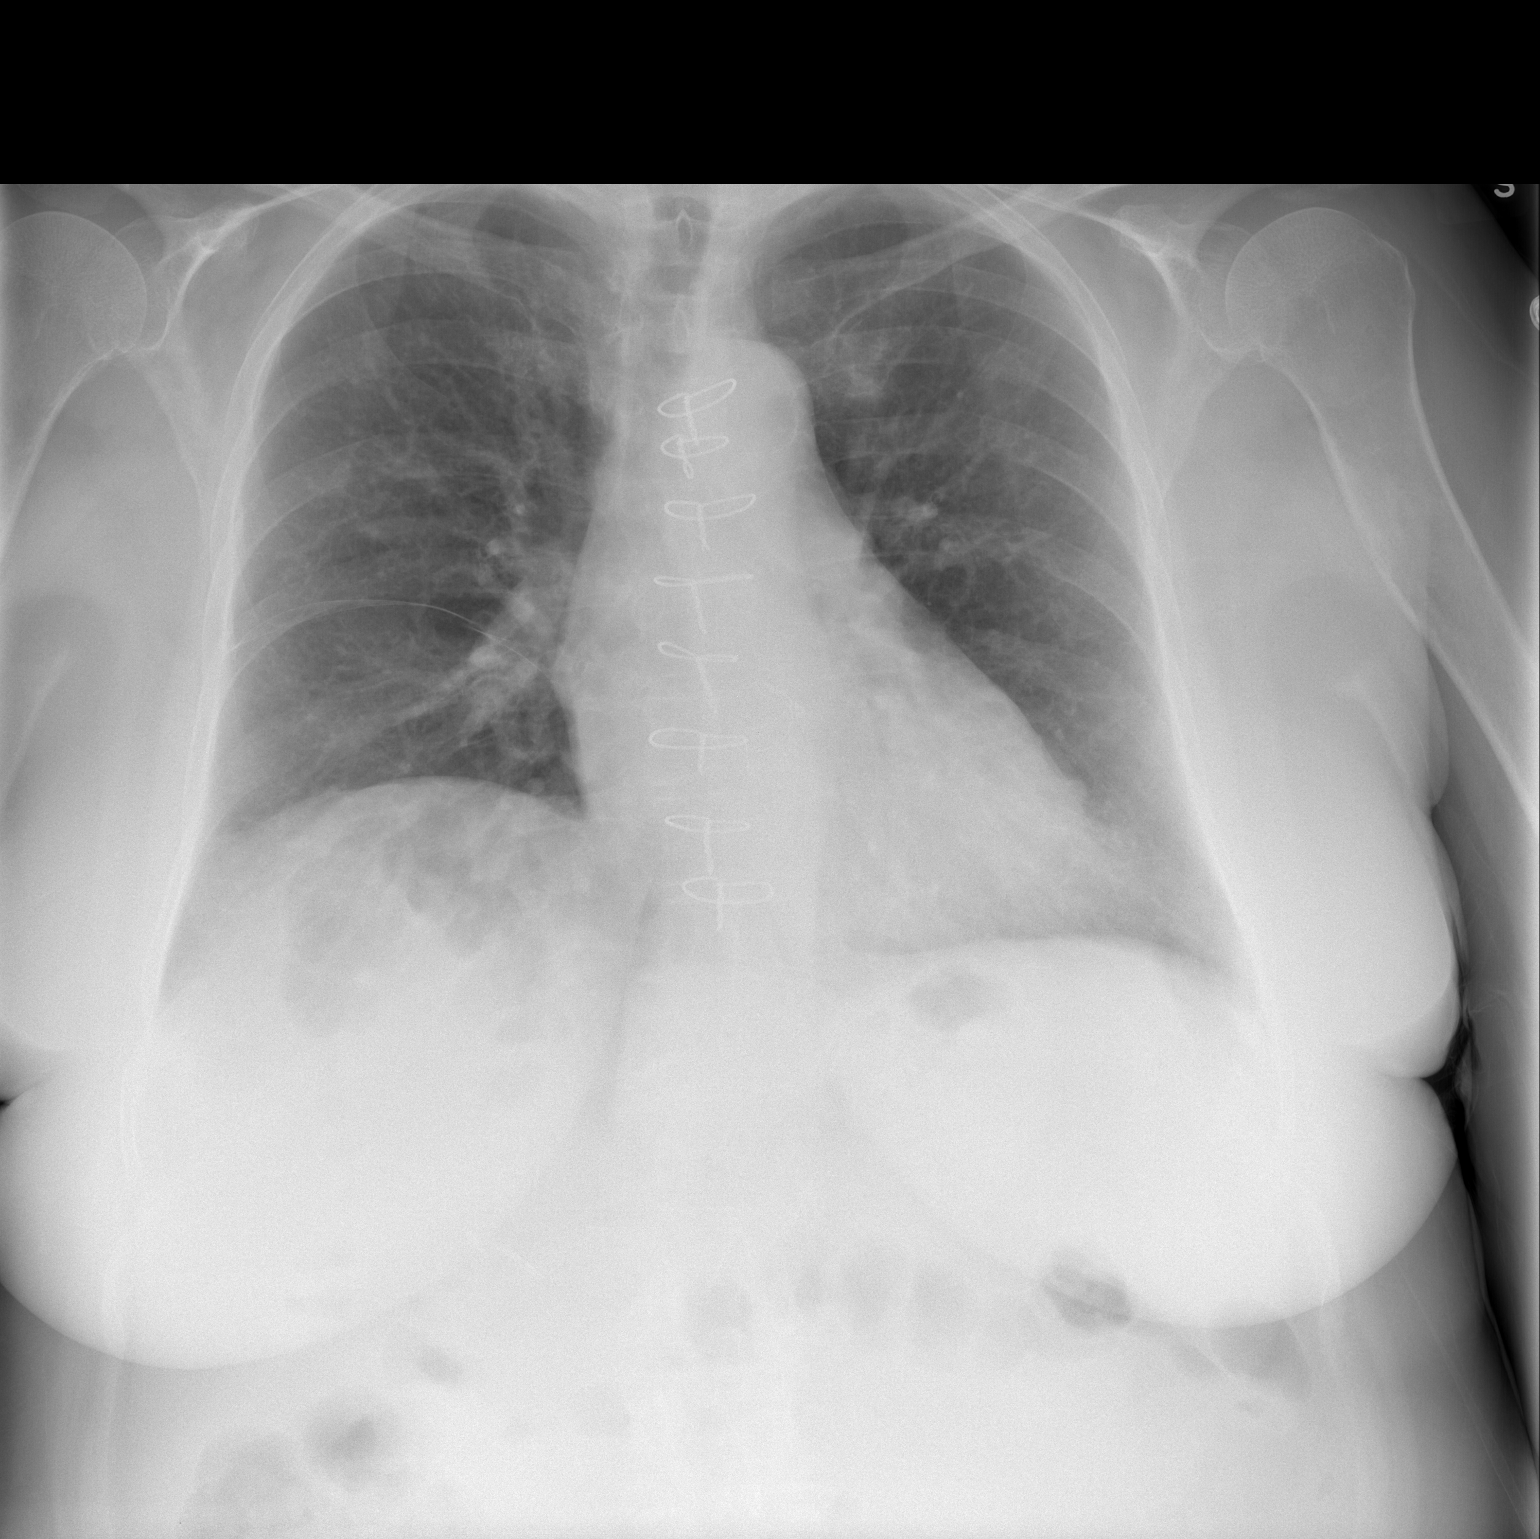

[w chest lat]
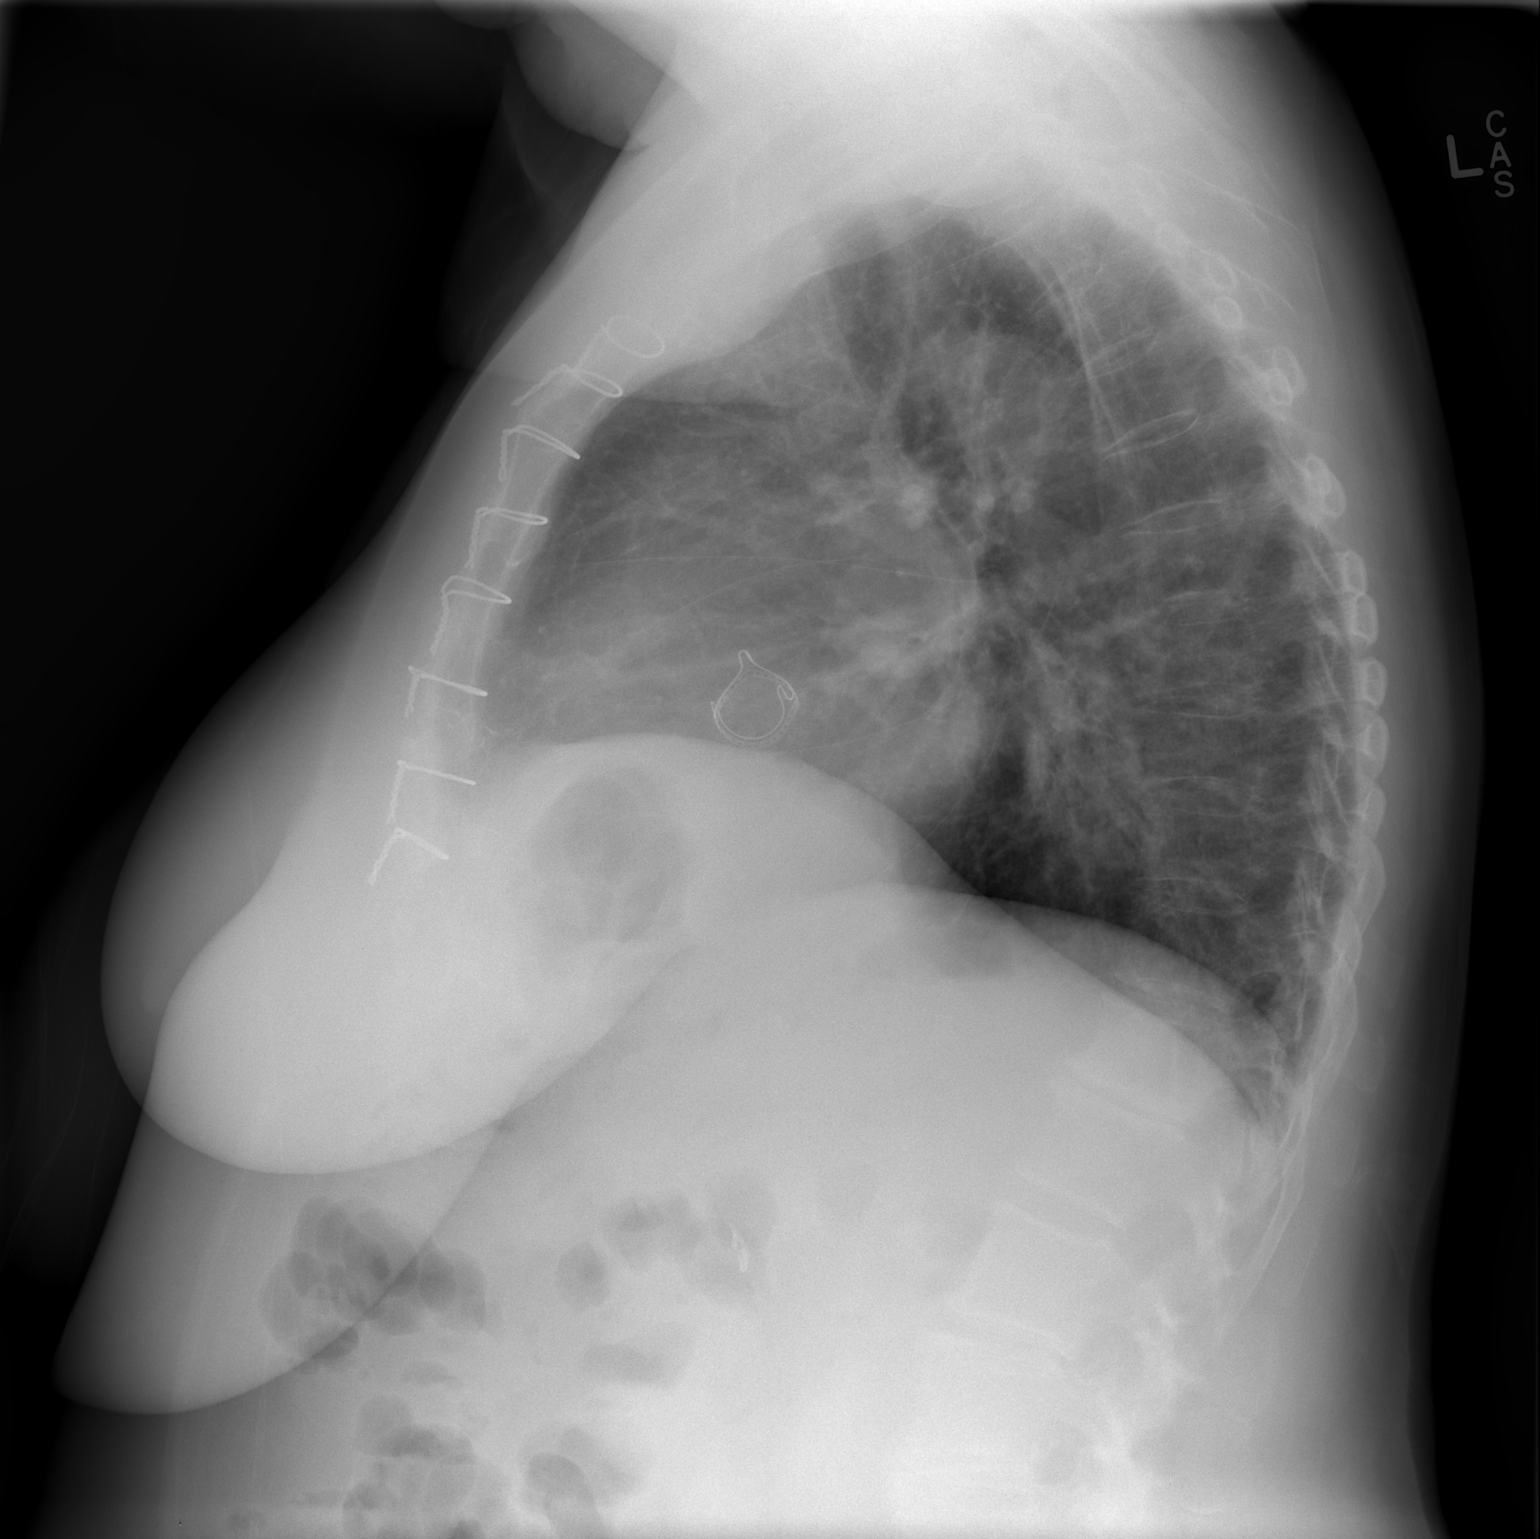

[2 of 2 positions shown; findings below may reference images not displayed]

FINDINGS: Aortic valve replacement. Heart size and vascularity are normal.
Negative for heart failure. Negative for pneumonia or effusion.
Lungs are clear.
IMPRESSION: No active cardiopulmonary disease.

## 2015-12-23 IMAGING — CT CT ABD-PELV W/ CM
2 of 5 series · 16 of 46 positions shown, 18 images · IV contrast (OMNIPAQUE)
Comparison: None available for comparison at time of study
interpretation.

CLINICAL DATA: Back injury neck hearing aid generalized weakness
and abdominal pain.

EXAM:
CT ABDOMEN AND PELVIS WITH CONTRAST
TECHNIQUE: Multidetector CT imaging of the abdomen and pelvis was performed
using the standard protocol following bolus administration of
intravenous contrast.
CONTRAST:  100mL OMNIPAQUE IOHEXOL 300 MG/ML  SOLN

[Series 2: rtn a/p with · axial · 0.79mm/px · z∈[-420,-10]mm · 13 of 92 slices shown, 15 images]
[im 5/92  soft-tissue]
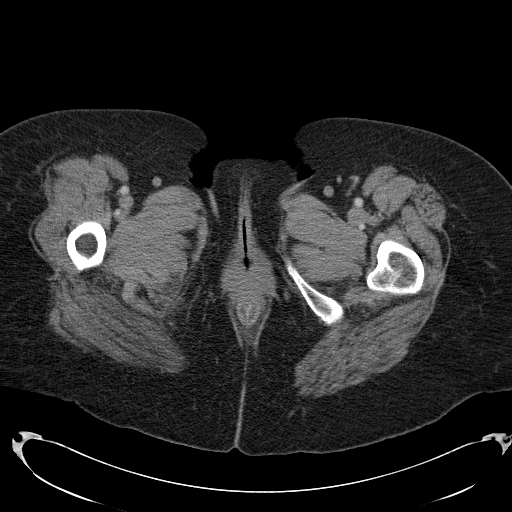
[im 5/92  bone]
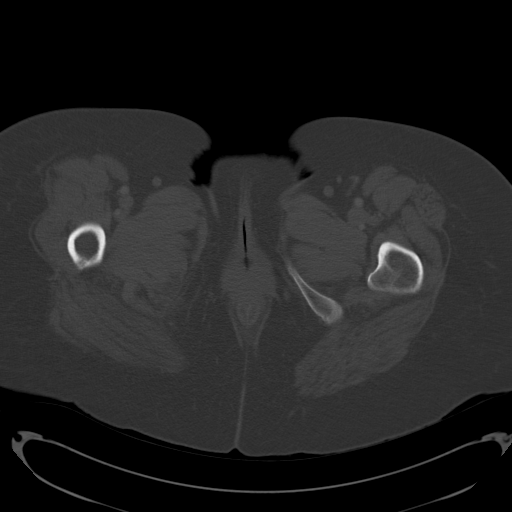
[im 15/92  soft-tissue]
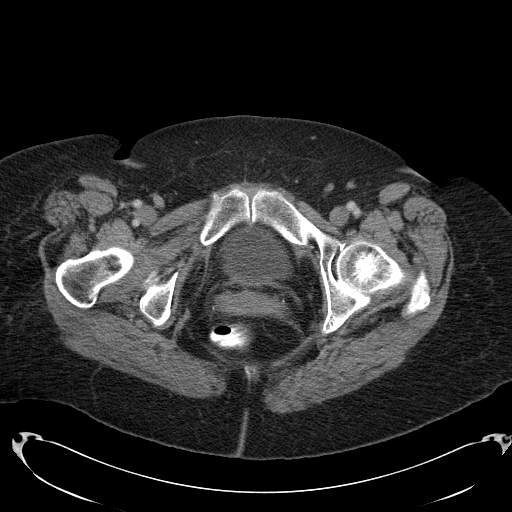
[im 20/92  soft-tissue]
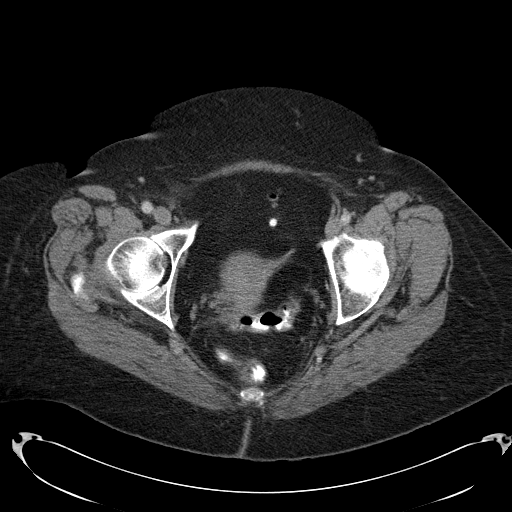
[im 24/92  soft-tissue]
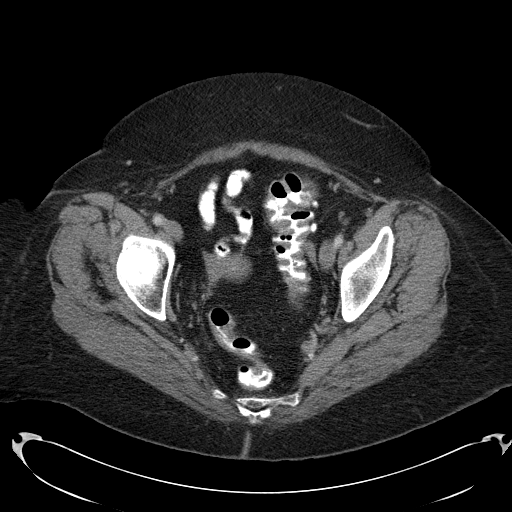
[im 34/92  soft-tissue]
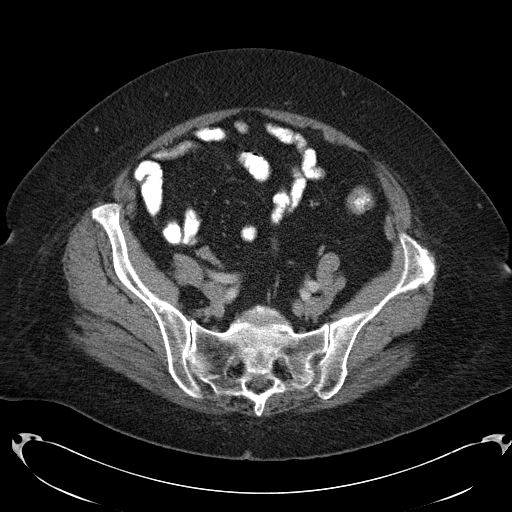
[im 39/92  soft-tissue]
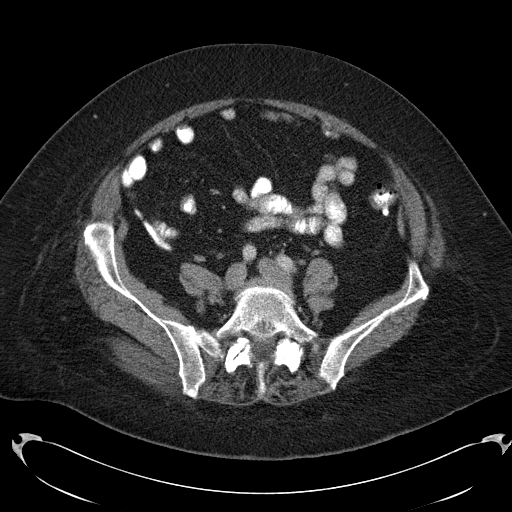
[im 48/92  soft-tissue]
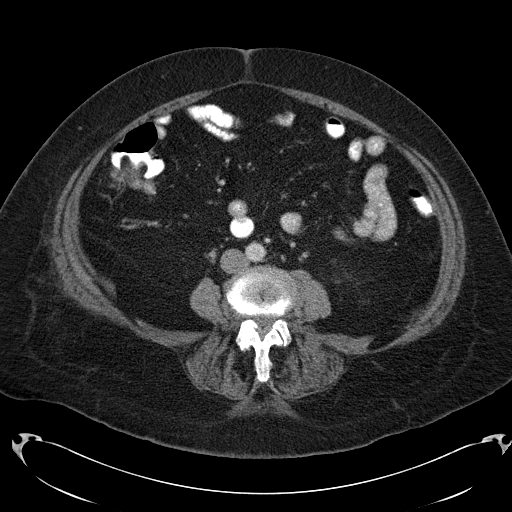
[im 53/92  soft-tissue]
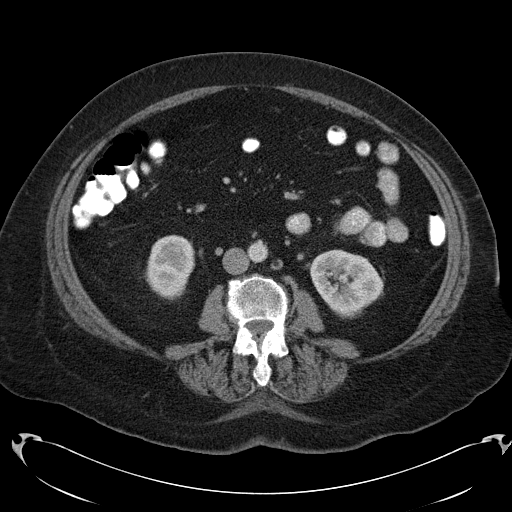
[im 58/92  soft-tissue]
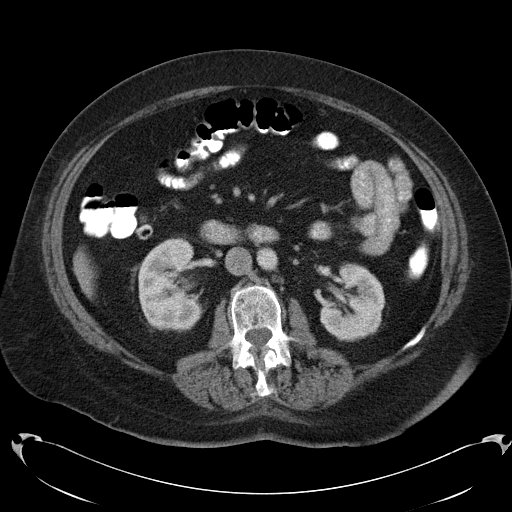
[im 58/92  bone]
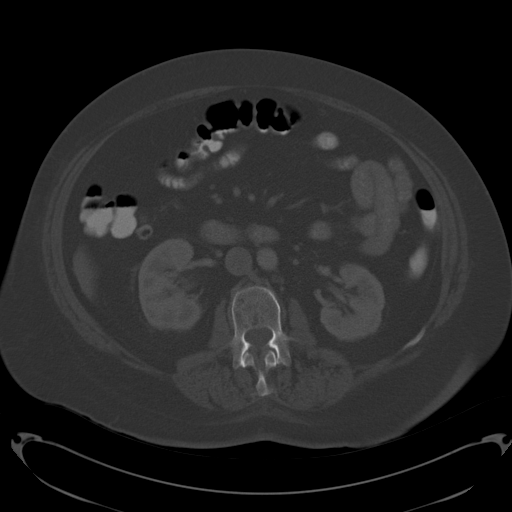
[im 68/92  soft-tissue]
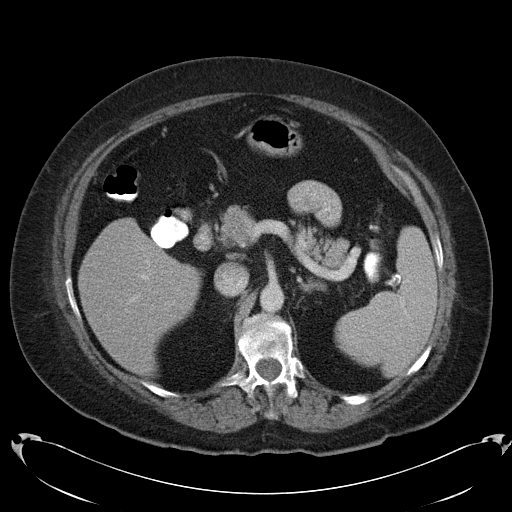
[im 72/92  soft-tissue]
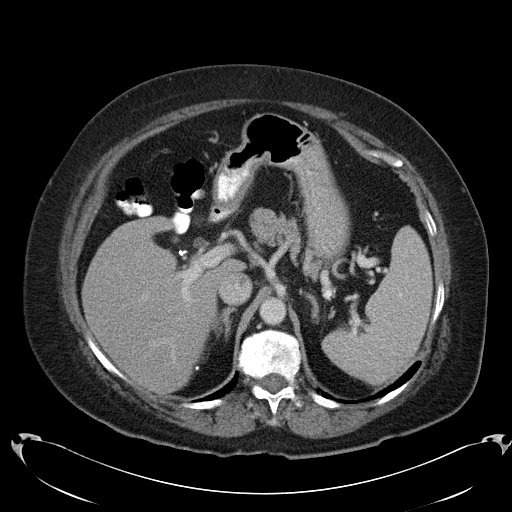
[im 77/92  soft-tissue]
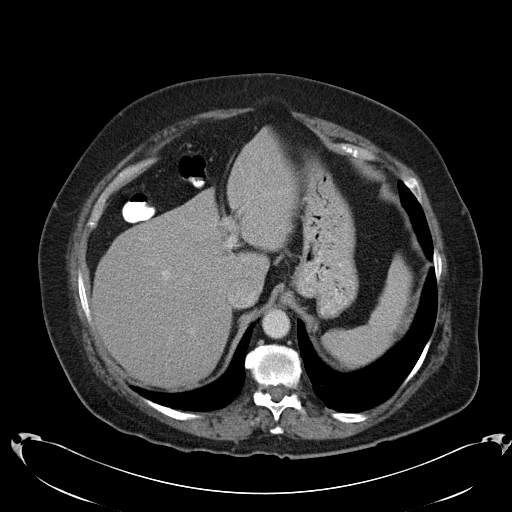
[im 87/92  soft-tissue]
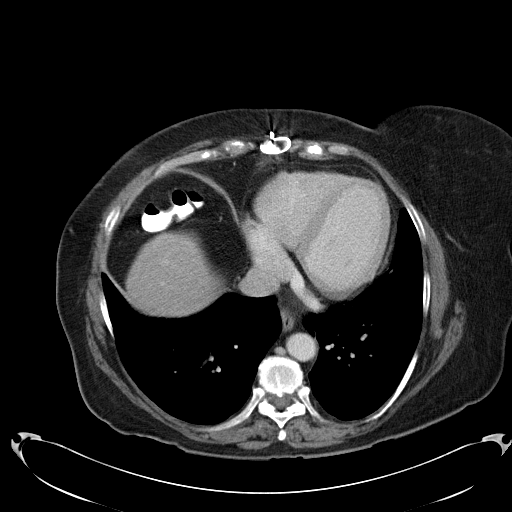

[Series 602: <mpr thick range> · coronal · 0.90mm/px · 3 of 170 slices shown]
[im 57/170  soft-tissue]
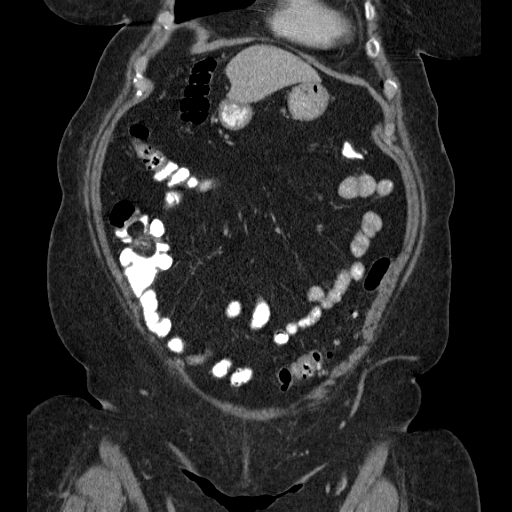
[im 76/170  soft-tissue]
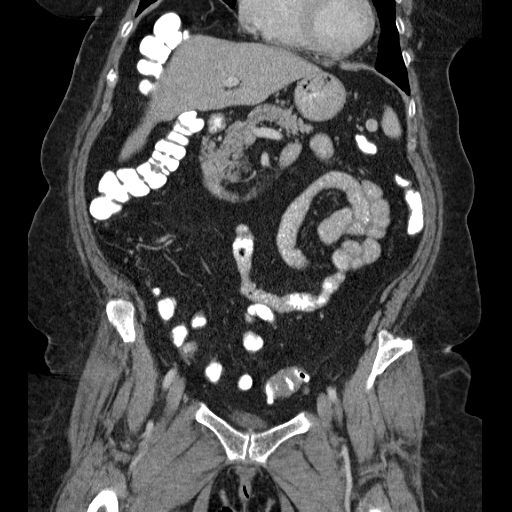
[im 94/170  soft-tissue]
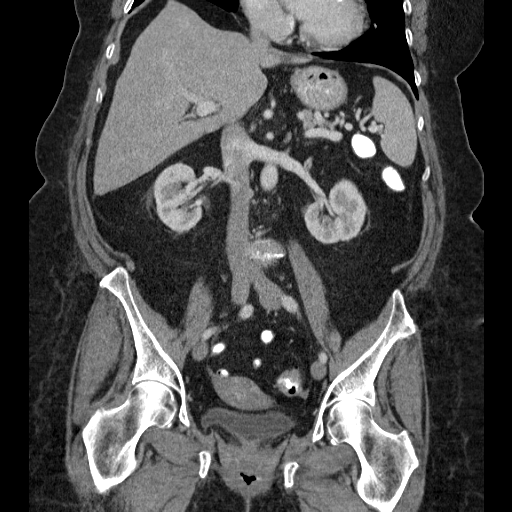

[16 of 46 positions shown; findings below may reference images not displayed]

FINDINGS: LUNG BASES: Included view of the lung bases are clear. Included
heart size is normal, status post median sternotomy. Visualized
pericardium is unremarkable.

SOLID ORGANS: The spleen, pancreas and adrenal glands are
unremarkable. Status post cholecystectomy. Thirteen calcified
splenic aneurysm. Subcentimeter cyst in right lobe of the liver,
which is otherwise unremarkable.

GASTROINTESTINAL TRACT: The stomach, small bowel are normal in
course and caliber without inflammatory changes. Colonic
interpositioning. Colonic diverticulosis with mild sigmoid wall
thickening, no superimposed pericolonic inflammation. Normal
appendix.

KIDNEYS/ URINARY TRACT: Kidneys are orthotopic, demonstrating
symmetric enhancement. No nephrolithiasis, hydronephrosis or solid
renal masses. The unopacified ureters are normal in course and
caliber. Too small to characterize hypodensity lower pole right
kidney. Delayed imaging through the kidneys demonstrates symmetric
prompt contrast excretion within the proximal urinary collecting
system. Urinary bladder is partially distended and unremarkable.

PERITONEUM/RETROPERITONEUM: No intraperitoneal free fluid nor free
air. Aortoiliac vessels are normal in course and caliber. No
lymphadenopathy by CT size criteria. Internal reproductive organs
are unremarkable.

SOFT TISSUE/OSSEOUS STRUCTURES: Fat containing superior ventral
hernia. Grade 1 L4-5 anterolisthesis without spondylolysis. No
destructive bony lesions.
IMPRESSION: Colonic diverticulosis with sigmoid wall thickening favoring chronic
diverticulitis without CT findings of acute diverticulitis though,
recommend clinical correlation. No bowel obstruction.

Status post cholecystectomy.

13 mm calcified splenic artery aneurysm.

  By: Sahit Imbesi
